# Patient Record
Sex: Female | Born: 1935 | Race: White | Hispanic: No
Health system: Southern US, Community
[De-identification: ages and names within clinical notes are randomized; demographics above are authoritative.]

## PROBLEM LIST (undated history)

## (undated) DIAGNOSIS — J9621 Acute and chronic respiratory failure with hypoxia: Secondary | ICD-10-CM

## (undated) DIAGNOSIS — F0280 Dementia in other diseases classified elsewhere without behavioral disturbance: Secondary | ICD-10-CM

## (undated) DIAGNOSIS — A419 Sepsis, unspecified organism: Secondary | ICD-10-CM

## (undated) DIAGNOSIS — I5032 Chronic diastolic (congestive) heart failure: Secondary | ICD-10-CM

## (undated) DIAGNOSIS — R6520 Severe sepsis without septic shock: Secondary | ICD-10-CM

## (undated) DIAGNOSIS — I482 Chronic atrial fibrillation, unspecified: Secondary | ICD-10-CM

---

## 2019-08-15 ENCOUNTER — Inpatient Hospital Stay
Admission: AD | Admit: 2019-08-15 | Discharge: 2019-09-25 | Disposition: A | Discharge status: Skilled Nursing Facility | Payer: Medicare Other | Source: Other Acute Inpatient Hospital | Attending: Internal Medicine | Admitting: Internal Medicine

## 2019-08-15 ENCOUNTER — Other Ambulatory Visit (HOSPITAL_COMMUNITY): Payer: Medicare Other

## 2019-08-15 DIAGNOSIS — Z4682 Encounter for fitting and adjustment of non-vascular catheter: Secondary | ICD-10-CM

## 2019-08-15 DIAGNOSIS — A419 Sepsis, unspecified organism: Secondary | ICD-10-CM | POA: Diagnosis present

## 2019-08-15 DIAGNOSIS — J9621 Acute and chronic respiratory failure with hypoxia: Secondary | ICD-10-CM | POA: Diagnosis present

## 2019-08-15 DIAGNOSIS — R52 Pain, unspecified: Secondary | ICD-10-CM

## 2019-08-15 DIAGNOSIS — I5032 Chronic diastolic (congestive) heart failure: Secondary | ICD-10-CM | POA: Diagnosis present

## 2019-08-15 DIAGNOSIS — Z931 Gastrostomy status: Secondary | ICD-10-CM

## 2019-08-15 DIAGNOSIS — F0280 Dementia in other diseases classified elsewhere without behavioral disturbance: Secondary | ICD-10-CM | POA: Diagnosis present

## 2019-08-15 DIAGNOSIS — J189 Pneumonia, unspecified organism: Secondary | ICD-10-CM

## 2019-08-15 DIAGNOSIS — G309 Alzheimer's disease, unspecified: Secondary | ICD-10-CM | POA: Diagnosis present

## 2019-08-15 DIAGNOSIS — I482 Chronic atrial fibrillation, unspecified: Secondary | ICD-10-CM | POA: Diagnosis present

## 2019-08-15 HISTORY — DX: Dementia in other diseases classified elsewhere, unspecified severity, without behavioral disturbance, psychotic disturbance, mood disturbance, and anxiety: F02.80

## 2019-08-15 HISTORY — DX: Chronic diastolic (congestive) heart failure: I50.32

## 2019-08-15 HISTORY — DX: Acute and chronic respiratory failure with hypoxia: J96.21

## 2019-08-15 HISTORY — DX: Sepsis, unspecified organism: A41.9

## 2019-08-15 HISTORY — DX: Chronic atrial fibrillation, unspecified: I48.20

## 2019-08-15 HISTORY — DX: Severe sepsis without septic shock: R65.20

## 2019-08-15 LAB — BLOOD GAS, ARTERIAL
Acid-Base Excess: 2.2 mmol/L — ABNORMAL HIGH (ref 0.0–2.0)
Bicarbonate: 25.4 mmol/L (ref 20.0–28.0)
FIO2: 28
O2 Saturation: 90.7 %
Patient temperature: 37
pCO2 arterial: 33.8 mmHg (ref 32.0–48.0)
pH, Arterial: 7.489 — ABNORMAL HIGH (ref 7.350–7.450)
pO2, Arterial: 58.7 mmHg — ABNORMAL LOW (ref 83.0–108.0)

## 2019-08-16 DIAGNOSIS — A419 Sepsis, unspecified organism: Secondary | ICD-10-CM

## 2019-08-16 DIAGNOSIS — I5032 Chronic diastolic (congestive) heart failure: Secondary | ICD-10-CM | POA: Diagnosis not present

## 2019-08-16 DIAGNOSIS — G309 Alzheimer's disease, unspecified: Secondary | ICD-10-CM | POA: Diagnosis not present

## 2019-08-16 DIAGNOSIS — F0280 Dementia in other diseases classified elsewhere without behavioral disturbance: Secondary | ICD-10-CM

## 2019-08-16 DIAGNOSIS — J9621 Acute and chronic respiratory failure with hypoxia: Secondary | ICD-10-CM

## 2019-08-16 DIAGNOSIS — I482 Chronic atrial fibrillation, unspecified: Secondary | ICD-10-CM | POA: Diagnosis not present

## 2019-08-16 DIAGNOSIS — R6520 Severe sepsis without septic shock: Secondary | ICD-10-CM

## 2019-08-16 LAB — CBC WITH DIFFERENTIAL/PLATELET
Abs Immature Granulocytes: 0.09 10*3/uL — ABNORMAL HIGH (ref 0.00–0.07)
Basophils Absolute: 0 10*3/uL (ref 0.0–0.1)
Basophils Relative: 0 %
Eosinophils Absolute: 0.1 10*3/uL (ref 0.0–0.5)
Eosinophils Relative: 1 %
HCT: 34.2 % — ABNORMAL LOW (ref 36.0–46.0)
Hemoglobin: 10.6 g/dL — ABNORMAL LOW (ref 12.0–15.0)
Immature Granulocytes: 1 %
Lymphocytes Relative: 6 %
Lymphs Abs: 0.7 10*3/uL (ref 0.7–4.0)
MCH: 31.4 pg (ref 26.0–34.0)
MCHC: 31 g/dL (ref 30.0–36.0)
MCV: 101.2 fL — ABNORMAL HIGH (ref 80.0–100.0)
Monocytes Absolute: 0.6 10*3/uL (ref 0.1–1.0)
Monocytes Relative: 5 %
Neutro Abs: 10.9 10*3/uL — ABNORMAL HIGH (ref 1.7–7.7)
Neutrophils Relative %: 87 %
Platelets: 260 10*3/uL (ref 150–400)
RBC: 3.38 MIL/uL — ABNORMAL LOW (ref 3.87–5.11)
RDW: 16 % — ABNORMAL HIGH (ref 11.5–15.5)
WBC: 12.4 10*3/uL — ABNORMAL HIGH (ref 4.0–10.5)
nRBC: 0 % (ref 0.0–0.2)

## 2019-08-16 LAB — COMPREHENSIVE METABOLIC PANEL
ALT: 19 U/L (ref 0–44)
AST: 11 U/L — ABNORMAL LOW (ref 15–41)
Albumin: 2.3 g/dL — ABNORMAL LOW (ref 3.5–5.0)
Alkaline Phosphatase: 61 U/L (ref 38–126)
Anion gap: 9 (ref 5–15)
BUN: 32 mg/dL — ABNORMAL HIGH (ref 8–23)
CO2: 26 mmol/L (ref 22–32)
Calcium: 8.6 mg/dL — ABNORMAL LOW (ref 8.9–10.3)
Chloride: 107 mmol/L (ref 98–111)
Creatinine, Ser: 0.67 mg/dL (ref 0.44–1.00)
GFR calc Af Amer: >60 mL/min (ref >60)
GFR calc non Af Amer: >60 mL/min (ref >60)
Glucose, Bld: 186 mg/dL — ABNORMAL HIGH (ref 70–99)
Potassium: 4.8 mmol/L (ref 3.5–5.1)
Sodium: 142 mmol/L (ref 135–145)
Total Bilirubin: 0.4 mg/dL (ref 0.3–1.2)
Total Protein: 5.2 g/dL — ABNORMAL LOW (ref 6.5–8.1)

## 2019-08-16 MED ORDER — GENERIC EXTERNAL MEDICATION
Status: DC
Start: ? — End: 2019-08-16

## 2019-08-16 MED ORDER — INSULIN LISPRO 100 UNIT/ML ~~LOC~~ SOLN
1.00 | SUBCUTANEOUS | Status: DC
Start: ? — End: 2019-08-16

## 2019-08-16 MED ORDER — AMIODARONE HCL 200 MG PO TABS
200.00 | ORAL_TABLET | ORAL | Status: DC
Start: 2019-08-15 — End: 2019-08-16

## 2019-08-16 MED ORDER — METOPROLOL TARTRATE 5 MG/5ML IV SOLN
2.50 | INTRAVENOUS | Status: DC
Start: ? — End: 2019-08-16

## 2019-08-16 MED ORDER — ACETAMINOPHEN 160 MG/5ML PO SUSP
650.00 | ORAL | Status: DC
Start: ? — End: 2019-08-16

## 2019-08-16 MED ORDER — POLYETHYLENE GLYCOL 3350 17 GM/SCOOP PO POWD
17.00 | ORAL | Status: DC
Start: ? — End: 2019-08-16

## 2019-08-16 MED ORDER — ENOXAPARIN SODIUM 60 MG/0.6ML ~~LOC~~ SOLN
60.00 | SUBCUTANEOUS | Status: DC
Start: 2019-08-15 — End: 2019-08-16

## 2019-08-16 MED ORDER — MIDODRINE HCL 5 MG PO TABS
5.00 | ORAL_TABLET | ORAL | Status: DC
Start: 2019-08-15 — End: 2019-08-16

## 2019-08-16 MED ORDER — SODIUM CHLORIDE 0.9 % IV SOLN
10.00 | INTRAVENOUS | Status: DC
Start: ? — End: 2019-08-16

## 2019-08-16 MED ORDER — GENERIC EXTERNAL MEDICATION
2.00 | Status: DC
Start: 2019-08-15 — End: 2019-08-16

## 2019-08-16 MED ORDER — GUAIFENESIN 400 MG PO TABS
400.00 | ORAL_TABLET | ORAL | Status: DC
Start: 2019-08-15 — End: 2019-08-16

## 2019-08-16 MED ORDER — ONDANSETRON HCL 4 MG/2ML IJ SOLN
4.00 | INTRAMUSCULAR | Status: DC
Start: ? — End: 2019-08-16

## 2019-08-16 MED ORDER — FIRST-LANSOPRAZOLE 3 MG/ML PO SUSP
30.00 | ORAL | Status: DC
Start: 2019-08-16 — End: 2019-08-16

## 2019-08-16 MED ORDER — HYDROCORTISONE NA SUCCINATE PF 100 MG IJ SOLR
50.00 | INTRAMUSCULAR | Status: DC
Start: 2019-08-15 — End: 2019-08-16

## 2019-08-16 MED ORDER — FENTANYL CITRATE (PF) 2500 MCG/50ML IJ SOLN
25.00 | INTRAMUSCULAR | Status: DC
Start: ? — End: 2019-08-16

## 2019-08-16 MED ORDER — GLYCOPYRROLATE 1 MG PO TABS
1.00 | ORAL_TABLET | ORAL | Status: DC
Start: 2019-08-15 — End: 2019-08-16

## 2019-08-16 MED ORDER — INSULIN LISPRO 100 UNIT/ML ~~LOC~~ SOLN
1.00 | SUBCUTANEOUS | Status: DC
Start: 2019-08-15 — End: 2019-08-16

## 2019-08-16 MED ORDER — INSULIN GLARGINE 100 UNIT/ML ~~LOC~~ SOLN
1.00 | SUBCUTANEOUS | Status: DC
Start: 2019-08-15 — End: 2019-08-16

## 2019-08-16 MED ORDER — DOCUSATE SODIUM 150 MG/15ML PO LIQD
100.00 | ORAL | Status: DC
Start: 2019-08-15 — End: 2019-08-16

## 2019-08-16 NOTE — Consult Note (Signed)
[facility]  PULMONARY SERVICE  Date of Service: 08/16/2019  PULMONARY CRITICAL CARE CONSULT   Tina Fuentes  YQM:[id number]  DOB: 1928/10/26   DOA: 08/15/2019  Referring Physician: Merton Border, MD  HPI: Tina Fuentes is a 86 y.o. female seen for follow up of Acute on Chronic Respiratory Failure.  Patient has multiple medical problems including pulmonary hypertension delirium aspiration pneumonia hypertension chronic anticoagulation chronic diastolic heart failure Alzheimer's disease chronic atrial fibrillation presented to the hospital with a diagnosis of sepsis encephalopathy multifocal pneumonia acute renal failure.  Patient apparently became acutely confused and stopped talking.  She came into the ED as a code stroke MRI was done which was negative neurology saw the patient was felt that she was confused because of a urinary tract infection.  Chest x-ray also did show what appeared to be a pneumonia.  Patient was admitted to the ICU because of increased respiratory rate apnea.  Apparently patient had been DNR/DNI on admission however the son rescinded the DNR/DNI and made the patient a full code.  Subsequently patient decompensated ended up being intubated despite discussion of goals of care with the family.  The son did not want palliative care involved in the care of the patient.  Patient did received pressors for severe sepsis.  Subsequently after it not being able to come off the ventilator patient ended up having to have a tracheostomy done.  Review of Systems:  ROS performed and is unremarkable other than noted above.    Medications: Reviewed on Rounds  Physical Exam:  Vitals: Temperature is 98.0 pulse 95 respiratory 23 blood pressure is 166/92 saturations 100%  Ventilator Settings mode ventilation pressure assist control FiO2 is 28% PEEP 5 tidal volume 479  . General: Comfortable at this time . Eyes: Grossly normal lids, irises  & conjunctiva . ENT: grossly tongue is normal . Neck: no obvious mass . Cardiovascular: S1-S2 normal no gallop or rub at this time . Respiratory: No rhonchi no rales are noted at this time . Abdomen: Soft and nontender . Skin: no rash seen on limited exam . Musculoskeletal: not rigid . Psychiatric:unable to assess . Neurologic: no seizure no involuntary movements         Labs on Admission:  Basic Metabolic Panel: Recent Labs  Lab 08/16/19 0631  NA 142  K 4.8  CL 107  CO2 26  GLUCOSE 186*  BUN 32*  CREATININE 0.67  CALCIUM 8.6*    Recent Labs  Lab 08/15/19 1800  PHART 7.489*  PCO2ART 33.8  PO2ART 58.7*  HCO3 25.4  O2SAT 90.7    Liver Function Tests: Recent Labs  Lab 08/16/19 0631  AST 11*  ALT 19  ALKPHOS 61  BILITOT 0.4  PROT 5.2*  ALBUMIN 2.3*   No results for input(s): LIPASE, AMYLASE in the last 168 hours. No results for input(s): AMMONIA in the last 168 hours.  CBC: Recent Labs  Lab 08/16/19 0631  WBC 12.4*  NEUTROABS 10.9*  HGB 10.6*  HCT 34.2*  MCV 101.2*  PLT 260    Cardiac Enzymes: No results for input(s): CKTOTAL, CKMB, CKMBINDEX, TROPONINI in the last 168 hours.  BNP (last 3 results) No results for input(s): BNP in the last 8760 hours.  ProBNP (last 3 results) No results for input(s): PROBNP in the last 8760 hours.   Radiological Exams on Admission: DG Abd 1 View  Result Date: 08/15/2019 CLINICAL DATA:  Status post percutaneous endoscopic gastrostomy. Assess tube  placement. EXAM: ABDOMEN - 1 VIEW COMPARISON:  None. FINDINGS: Two views were performed after injection of 50 cc of Omnipaque 300 by the clinical service. Contrast fills the fundus of the stomach. I do not see any extravasated contrast or air. No sign of ileus or obstruction. Rectal probe in place. Chronic curvature in degenerative change of the spine. IMPRESSION: Peg tube within the stomach.  No extravasation demonstrated. Electronically Signed   By: Paulina Fusi M.D.    On: 08/15/2019 18:15   DG CHEST PORT 1 VIEW  Result Date: 08/15/2019 CLINICAL DATA:  Chest tube placement peg placement.  Pneumonia. EXAM: PORTABLE CHEST 1 VIEW COMPARISON:  None. FINDINGS: Tracheostomy tube in place. Right arm PICC tip at the SVC RA junction. Extensive artifact overlies the chest. The right chest is largely clear. There may be mild right base atelectasis. On the left, there is abnormal opacity in the lower chest that probably represents combination of pleural fluid and volume loss/infiltrate. No evidence of pneumothorax. The upper lungs are largely clear. IMPRESSION: Tracheostomy.  Right arm PICC well positioned. Abnormal opacity in the left lower chest, probably a combination of pleural fluid and left lower lobe infiltrate/volume loss. Electronically Signed   By: Paulina Fusi M.D.   On: 08/15/2019 18:14    Assessment/Plan Active Problems:   Acute on chronic respiratory failure with hypoxia (HCC)   Severe sepsis (HCC)   Chronic atrial fibrillation (HCC)   Chronic diastolic heart failure of unknown etiology (HCC)   Alzheimer's dementia without behavioral disturbance (HCC)   1. Acute on chronic respiratory failure with hypoxia patient at this time is on the ventilator and full support.  Patient has been started on wean protocol for pressure support she actually tolerated fairly well today plan is clinically continue to advance weaning on pressure support as tolerated. 2. Severe sepsis treated with antibiotics plan is going to be to continue to follow closely for any decompensation with the blood pressures. 3. Chronic atrial fibrillation rate is controlled at this time we will continue with present management. 4. Chronic diastolic heart failure monitor fluid status currently is compensated 5. Alzheimer's disease no change we will continue with supportive care  I have personally seen and evaluated the patient, evaluated laboratory and imaging results, formulated the assessment and  plan and placed orders. The Patient requires high complexity decision making with multiple systems involvement.  Case was discussed on Rounds with the Respiratory Therapy Director and the Respiratory staff Time Spent  Yevonne Pax, MD [facility] Pulmonary Critical Care Medicine Sleep Medicine

## 2019-08-17 ENCOUNTER — Encounter: Payer: Self-pay | Admitting: Internal Medicine

## 2019-08-17 DIAGNOSIS — G309 Alzheimer's disease, unspecified: Secondary | ICD-10-CM | POA: Diagnosis not present

## 2019-08-17 DIAGNOSIS — I482 Chronic atrial fibrillation, unspecified: Secondary | ICD-10-CM | POA: Diagnosis present

## 2019-08-17 DIAGNOSIS — J9621 Acute and chronic respiratory failure with hypoxia: Secondary | ICD-10-CM | POA: Diagnosis not present

## 2019-08-17 DIAGNOSIS — I5032 Chronic diastolic (congestive) heart failure: Secondary | ICD-10-CM | POA: Diagnosis present

## 2019-08-17 DIAGNOSIS — F0280 Dementia in other diseases classified elsewhere without behavioral disturbance: Secondary | ICD-10-CM | POA: Diagnosis present

## 2019-08-17 DIAGNOSIS — A419 Sepsis, unspecified organism: Secondary | ICD-10-CM | POA: Diagnosis present

## 2019-08-17 MED ORDER — GENERIC EXTERNAL MEDICATION
Status: DC
Start: ? — End: 2019-08-17

## 2019-08-17 NOTE — Progress Notes (Addendum)
Pulmonary Critical Care Medicine [facility] [place]   PULMONARY CRITICAL CARE SERVICE  PROGRESS NOTE  Date of Service: 08/17/2019  Tina Fuentes  WJX:[id number]  DOB: Dec 07, 1928   DOA: 08/15/2019  Referring Physician: Carron Curie, MD  HPI: Tina Fuentes is a 86 y.o. female seen for follow up of Acute on Chronic Respiratory Failure.  Patient continues on pressure support for an 8-hour goal today satting well no fever distress.  Medications: Reviewed on Rounds  Physical Exam:  Vitals: Pulse 83 respirations 23 BP 126/73 O2 sat 99% temp 96.9  Ventilator Settings pressure support 12/5 FiO2 28%  . General: Comfortable at this time . Eyes: Grossly normal lids, irises & conjunctiva . ENT: grossly tongue is normal . Neck: no obvious mass . Cardiovascular: S1 S2 normal no gallop . Respiratory: No rales or rhonchi noted . Abdomen: soft . Skin: no rash seen on limited exam . Musculoskeletal: not rigid . Psychiatric:unable to assess . Neurologic: no seizure no involuntary movements         Lab Data:   Basic Metabolic Panel: Recent Labs  Lab 08/16/19 0631  NA 142  K 4.8  CL 107  CO2 26  GLUCOSE 186*  BUN 32*  CREATININE 0.67  CALCIUM 8.6*    ABG: Recent Labs  Lab 08/15/19 1800  PHART 7.489*  PCO2ART 33.8  PO2ART 58.7*  HCO3 25.4  O2SAT 90.7    Liver Function Tests: Recent Labs  Lab 08/16/19 0631  AST 11*  ALT 19  ALKPHOS 61  BILITOT 0.4  PROT 5.2*  ALBUMIN 2.3*   No results for input(s): LIPASE, AMYLASE in the last 168 hours. No results for input(s): AMMONIA in the last 168 hours.  CBC: Recent Labs  Lab 08/16/19 0631  WBC 12.4*  NEUTROABS 10.9*  HGB 10.6*  HCT 34.2*  MCV 101.2*  PLT 260    Cardiac Enzymes: No results for input(s): CKTOTAL, CKMB, CKMBINDEX, TROPONINI in the last 168 hours.  BNP (last 3 results) No results for input(s): BNP in the last 8760 hours.  ProBNP (last 3 results) No results for input(s): PROBNP in the  last 8760 hours.  Radiological Exams: DG Abd 1 View  Result Date: 08/15/2019 CLINICAL DATA:  Status post percutaneous endoscopic gastrostomy. Assess tube placement. EXAM: ABDOMEN - 1 VIEW COMPARISON:  None. FINDINGS: Two views were performed after injection of 50 cc of Omnipaque 300 by the clinical service. Contrast fills the fundus of the stomach. I do not see any extravasated contrast or air. No sign of ileus or obstruction. Rectal probe in place. Chronic curvature in degenerative change of the spine. IMPRESSION: Peg tube within the stomach.  No extravasation demonstrated. Electronically Signed   By: Paulina Fusi M.D.   On: 08/15/2019 18:15   DG CHEST PORT 1 VIEW  Result Date: 08/15/2019 CLINICAL DATA:  Chest tube placement peg placement.  Pneumonia. EXAM: PORTABLE CHEST 1 VIEW COMPARISON:  None. FINDINGS: Tracheostomy tube in place. Right arm PICC tip at the SVC RA junction. Extensive artifact overlies the chest. The right chest is largely clear. There may be mild right base atelectasis. On the left, there is abnormal opacity in the lower chest that probably represents combination of pleural fluid and volume loss/infiltrate. No evidence of pneumothorax. The upper lungs are largely clear. IMPRESSION: Tracheostomy.  Right arm PICC well positioned. Abnormal opacity in the left lower chest, probably a combination of pleural fluid and left lower lobe infiltrate/volume loss. Electronically Signed   By: Loraine Leriche  Shogry M.D.   On: 08/15/2019 18:14    Assessment/Plan Active Problems:   Acute on chronic respiratory failure with hypoxia (HCC)   Severe sepsis (HCC)   Chronic atrial fibrillation (HCC)   Chronic diastolic heart failure of unknown etiology ([place])   Alzheimer's dementia without behavioral disturbance ([place])   1. Acute on chronic respiratory failure with hypoxia wean today on pressure support over 5 FiO2 28% for an 8-hour goal currently doing well we will continue supportive measures and weaning.   Continue aggressive pulmonary toilet. 2. Severe sepsis treated with antibiotics plan is going to be to continue to follow closely for any decompensation with the blood pressures. 3. Chronic atrial fibrillation rate is controlled at this time we will continue with present management. 4. Chronic diastolic heart failure monitor fluid status currently is compensated 5. Alzheimer's disease no change we will continue with supportive care   I have personally seen and evaluated the patient, evaluated laboratory and imaging results, formulated the assessment and plan and placed orders. The Patient requires high complexity decision making with multiple systems involvement.  Rounds were done with the Respiratory Therapy Director and Staff therapists and discussed with nursing staff also.  Allyne Gee, MD [facility] Pulmonary Critical Care Medicine Sleep Medicine

## 2019-08-18 DIAGNOSIS — I5032 Chronic diastolic (congestive) heart failure: Secondary | ICD-10-CM | POA: Diagnosis not present

## 2019-08-18 DIAGNOSIS — I482 Chronic atrial fibrillation, unspecified: Secondary | ICD-10-CM | POA: Diagnosis not present

## 2019-08-18 DIAGNOSIS — J9621 Acute and chronic respiratory failure with hypoxia: Secondary | ICD-10-CM | POA: Diagnosis not present

## 2019-08-18 DIAGNOSIS — G309 Alzheimer's disease, unspecified: Secondary | ICD-10-CM | POA: Diagnosis not present

## 2019-08-18 NOTE — Progress Notes (Addendum)
Pulmonary Critical Care Medicine [facility] [place]   PULMONARY CRITICAL CARE SERVICE  PROGRESS NOTE  Date of Service: 08/18/2019  Tina Fuentes  [id number]:[id number]  DOB: 1928/11/14   DOA: 08/15/2019  Referring Physician: Carron Curie, MD  HPI: Tina Fuentes is a 86 y.o. female seen for follow up of Acute on Chronic Respiratory Failure.  Patient remains on pressure support today with a 12-hour goal.  Currently satting well no distress.  Medications: Reviewed on Rounds  Physical Exam:  Vitals: Pulse 90 respirations 29 BP 113/66 O2 sat 99% temp 98.6  Ventilator Settings pressure support 10/5 FiO2 28%  . General: Comfortable at this time . Eyes: Grossly normal lids, irises & conjunctiva . ENT: grossly tongue is normal . Neck: no obvious mass . Cardiovascular: S1 S2 normal no gallop . Respiratory: No rales or rhonchi noted . Abdomen: soft . Skin: no rash seen on limited exam . Musculoskeletal: not rigid . Psychiatric:unable to assess . Neurologic: no seizure no involuntary movements         Lab Data:   Basic Metabolic Panel: Recent Labs  Lab 08/16/19 0631  NA 142  K 4.8  CL 107  CO2 26  GLUCOSE 186*  BUN 32*  CREATININE 0.67  CALCIUM 8.6*    ABG: Recent Labs  Lab 08/15/19 1800  PHART 7.489*  PCO2ART 33.8  PO2ART 58.7*  HCO3 25.4  O2SAT 90.7    Liver Function Tests: Recent Labs  Lab 08/16/19 0631  AST 11*  ALT 19  ALKPHOS 61  BILITOT 0.4  PROT 5.2*  ALBUMIN 2.3*   No results for input(s): LIPASE, AMYLASE in the last 168 hours. No results for input(s): AMMONIA in the last 168 hours.  CBC: Recent Labs  Lab 08/16/19 0631  WBC 12.4*  NEUTROABS 10.9*  HGB 10.6*  HCT 34.2*  MCV 101.2*  PLT 260    Cardiac Enzymes: No results for input(s): CKTOTAL, CKMB, CKMBINDEX, TROPONINI in the last 168 hours.  BNP (last 3 results) No results for input(s): BNP in the last 8760 hours.  ProBNP (last 3 results) No results for input(s): PROBNP in  the last 8760 hours.  Radiological Exams: No results found.  Assessment/Plan Active Problems:   Acute on chronic respiratory failure with hypoxia (HCC)   Severe sepsis (HCC)   Chronic atrial fibrillation (HCC)   Chronic diastolic heart failure of unknown etiology (HCC)   Alzheimer's dementia without behavioral disturbance (HCC)   1. Acute on chronic respiratory failure with hypoxia patient will continue to wean currently has a 12-hour goal on 10/5 FiO2 28%.  Continue aggressive pulmonary toilet supportive measures. 2. Severe sepsis treated with antibiotics plan is going to be to continue to follow closely for any decompensation with the blood pressures. 3. Chronic atrial fibrillation rate is controlled at this time we will continue with present management. 4. Chronic diastolic heart failure monitor fluid status currently is compensated 5. Alzheimer's disease no change we will continue with supportive care   I have personally seen and evaluated the patient, evaluated laboratory and imaging results, formulated the assessment and plan and placed orders. The Patient requires high complexity decision making with multiple systems involvement.  Rounds were done with the Respiratory Therapy Director and Staff therapists and discussed with nursing staff also.  Yevonne Pax, MD [facility] Pulmonary Critical Care Medicine Sleep Medicine

## 2019-08-19 DIAGNOSIS — I482 Chronic atrial fibrillation, unspecified: Secondary | ICD-10-CM | POA: Diagnosis not present

## 2019-08-19 DIAGNOSIS — J9621 Acute and chronic respiratory failure with hypoxia: Secondary | ICD-10-CM | POA: Diagnosis not present

## 2019-08-19 DIAGNOSIS — G309 Alzheimer's disease, unspecified: Secondary | ICD-10-CM | POA: Diagnosis not present

## 2019-08-19 DIAGNOSIS — I5032 Chronic diastolic (congestive) heart failure: Secondary | ICD-10-CM | POA: Diagnosis not present

## 2019-08-19 MED ORDER — GENERIC EXTERNAL MEDICATION
Status: DC
Start: ? — End: 2019-08-19

## 2019-08-19 NOTE — Progress Notes (Signed)
Pulmonary Critical Care Medicine [facility] [place]   PULMONARY CRITICAL CARE SERVICE  PROGRESS NOTE  Date of Service: 08/19/2019  Tina Fuentes  [id number]  DOB: 1928/07/21   DOA: 08/15/2019  Referring Physician: Carron Curie, MD  HPI: Tina Fuentes is a 86 y.o. female seen for follow up of Acute on Chronic Respiratory Failure.  Patient is currently on pressure support wean is on 12/5 and requiring 28% FiO2 the goal is today for 16 hours  Medications: Reviewed on Rounds  Physical Exam:  Vitals: Temperature is 96.6 pulse 86 respiratory rate 20 blood pressure is 141/80 saturations 98%  Ventilator Settings mode ventilation pressure support FiO2 28% pressure 12 PEEP 5  . General: Comfortable at this time . Eyes: Grossly normal lids, irises & conjunctiva . ENT: grossly tongue is normal . Neck: no obvious mass . Cardiovascular: S1 S2 normal no gallop . Respiratory: No rhonchi no rales . Abdomen: soft . Skin: no rash seen on limited exam . Musculoskeletal: not rigid . Psychiatric:unable to assess . Neurologic: no seizure no involuntary movements         Lab Data:   Basic Metabolic Panel: Recent Labs  Lab 08/16/19 0631  NA 142  K 4.8  CL 107  CO2 26  GLUCOSE 186*  BUN 32*  CREATININE 0.67  CALCIUM 8.6*    ABG: Recent Labs  Lab 08/15/19 1800  PHART 7.489*  PCO2ART 33.8  PO2ART 58.7*  HCO3 25.4  O2SAT 90.7    Liver Function Tests: Recent Labs  Lab 08/16/19 0631  AST 11*  ALT 19  ALKPHOS 61  BILITOT 0.4  PROT 5.2*  ALBUMIN 2.3*   No results for input(s): LIPASE, AMYLASE in the last 168 hours. No results for input(s): AMMONIA in the last 168 hours.  CBC: Recent Labs  Lab 08/16/19 0631  WBC 12.4*  NEUTROABS 10.9*  HGB 10.6*  HCT 34.2*  MCV 101.2*  PLT 260    Cardiac Enzymes: No results for input(s): CKTOTAL, CKMB, CKMBINDEX, TROPONINI in the last 168 hours.  BNP (last 3 results) No results for input(s): BNP in the last 8760  hours.  ProBNP (last 3 results) No results for input(s): PROBNP in the last 8760 hours.  Radiological Exams: No results found.  Assessment/Plan Active Problems:   Acute on chronic respiratory failure with hypoxia (HCC)   Severe sepsis (HCC)   Chronic atrial fibrillation (HCC)   Chronic diastolic heart failure of unknown etiology (HCC)   Alzheimer's dementia without behavioral disturbance (HCC)   1. Acute on chronic respiratory failure with hypoxia continue with the wean goal of 16 hours on pressure support 2. Severe sepsis resolved clinically 3. Chronic atrial fibrillation rate controlled 4. Chronic diastolic heart failure right now is compensated we will continue to monitor 5. Alzheimer's dementia no change   I have personally seen and evaluated the patient, evaluated laboratory and imaging results, formulated the assessment and plan and placed orders. The Patient requires high complexity decision making with multiple systems involvement.  Rounds were done with the Respiratory Therapy Director and Staff therapists and discussed with nursing staff also.  Yevonne Pax, MD [facility] Pulmonary Critical Care Medicine Sleep Medicine

## 2019-08-20 DIAGNOSIS — J9621 Acute and chronic respiratory failure with hypoxia: Secondary | ICD-10-CM | POA: Diagnosis not present

## 2019-08-20 DIAGNOSIS — I482 Chronic atrial fibrillation, unspecified: Secondary | ICD-10-CM | POA: Diagnosis not present

## 2019-08-20 DIAGNOSIS — G309 Alzheimer's disease, unspecified: Secondary | ICD-10-CM | POA: Diagnosis not present

## 2019-08-20 DIAGNOSIS — I5032 Chronic diastolic (congestive) heart failure: Secondary | ICD-10-CM | POA: Diagnosis not present

## 2019-08-20 NOTE — Progress Notes (Signed)
Pulmonary Critical Care Medicine [facility] [place]   PULMONARY CRITICAL CARE SERVICE  PROGRESS NOTE  Date of Service: 08/20/2019  MAHOGANIE Fuentes  MEQ:[id number]  DOB: 1928/12/15   DOA: 08/15/2019  Referring Physician: Carron Curie, MD  HPI: Tina Fuentes is a 86 y.o. female seen for follow up of Acute on Chronic Respiratory Failure.  Patient is on T collar currently on 28% FiO2 doing fairly well  Medications: Reviewed on Rounds  Physical Exam:  Vitals: Temperature is 96.8 pulse 97 respiratory rate 27 blood pressure is 166/83 saturations 100%  Ventilator Settings on T collar with an FiO2 of 28%  . General: Comfortable at this time . Eyes: Grossly normal lids, irises & conjunctiva . ENT: grossly tongue is normal . Neck: no obvious mass . Cardiovascular: S1 S2 normal no gallop . Respiratory: Coarse breath sounds with few scattered rhonchi . Abdomen: soft . Skin: no rash seen on limited exam . Musculoskeletal: not rigid . Psychiatric:unable to assess . Neurologic: no seizure no involuntary movements         Lab Data:   Basic Metabolic Panel: Recent Labs  Lab 08/16/19 0631  NA 142  K 4.8  CL 107  CO2 26  GLUCOSE 186*  BUN 32*  CREATININE 0.67  CALCIUM 8.6*    ABG: Recent Labs  Lab 08/15/19 1800  PHART 7.489*  PCO2ART 33.8  PO2ART 58.7*  HCO3 25.4  O2SAT 90.7    Liver Function Tests: Recent Labs  Lab 08/16/19 0631  AST 11*  ALT 19  ALKPHOS 61  BILITOT 0.4  PROT 5.2*  ALBUMIN 2.3*   No results for input(s): LIPASE, AMYLASE in the last 168 hours. No results for input(s): AMMONIA in the last 168 hours.  CBC: Recent Labs  Lab 08/16/19 0631  WBC 12.4*  NEUTROABS 10.9*  HGB 10.6*  HCT 34.2*  MCV 101.2*  PLT 260    Cardiac Enzymes: No results for input(s): CKTOTAL, CKMB, CKMBINDEX, TROPONINI in the last 168 hours.  BNP (last 3 results) No results for input(s): BNP in the last 8760 hours.  ProBNP (last 3 results) No results for  input(s): PROBNP in the last 8760 hours.  Radiological Exams: No results found.  Assessment/Plan Active Problems:   Acute on chronic respiratory failure with hypoxia (HCC)   Severe sepsis (HCC)   Chronic atrial fibrillation (HCC)   Chronic diastolic heart failure of unknown etiology (HCC)   Alzheimer's dementia without behavioral disturbance (HCC)   1. Acute on chronic respiratory failure with hypoxia plan is to continue to wean on T collar she is doing well 2. Severe sepsis resolving hemodynamics are stable 3. Chronic atrial fibrillation rate controlled 4. Chronic diastolic heart failure unchanged 5. Alzheimer's dementia no change continue supportive care   I have personally seen and evaluated the patient, evaluated laboratory and imaging results, formulated the assessment and plan and placed orders. The Patient requires high complexity decision making with multiple systems involvement.  Rounds were done with the Respiratory Therapy Director and Staff therapists and discussed with nursing staff also.  Yevonne Pax, MD [facility] Pulmonary Critical Care Medicine Sleep Medicine

## 2019-08-21 DIAGNOSIS — J9621 Acute and chronic respiratory failure with hypoxia: Secondary | ICD-10-CM | POA: Diagnosis not present

## 2019-08-21 DIAGNOSIS — I5032 Chronic diastolic (congestive) heart failure: Secondary | ICD-10-CM | POA: Diagnosis not present

## 2019-08-21 DIAGNOSIS — I482 Chronic atrial fibrillation, unspecified: Secondary | ICD-10-CM | POA: Diagnosis not present

## 2019-08-21 DIAGNOSIS — G309 Alzheimer's disease, unspecified: Secondary | ICD-10-CM | POA: Diagnosis not present

## 2019-08-21 MED ORDER — GENERIC EXTERNAL MEDICATION
Status: DC
Start: ? — End: 2019-08-21

## 2019-08-21 NOTE — Progress Notes (Signed)
Pulmonary Critical Care Medicine [facility] [place]   PULMONARY CRITICAL CARE SERVICE  PROGRESS NOTE  Date of Service: 08/21/2019  Tina Fuentes  UVO:[id number]  DOB: [birth year]   DOA: 08/15/2019  Referring Physician: Carron Curie, MD  HPI: Tina Fuentes is a 86 y.o. female seen for follow up of Acute on Chronic Respiratory Failure.  Patient is on T collar currently on 1028% FiO2 goal today is for 8 hours  Medications: Reviewed on Rounds  Physical Exam:  Vitals: Temperature is 96.9 pulse 82 respiratory 18 blood pressure is 97/60 saturations 99%  Ventilator Settings off the ventilator on T collar trials  . General: Comfortable at this time . Eyes: Grossly normal lids, irises & conjunctiva . ENT: grossly tongue is normal . Neck: no obvious mass . Cardiovascular: S1 S2 normal no gallop . Respiratory: No rhonchi no rales are noted at this time . Abdomen: soft . Skin: no rash seen on limited exam . Musculoskeletal: not rigid . Psychiatric:unable to assess . Neurologic: no seizure no involuntary movements         Lab Data:   Basic Metabolic Panel: Recent Labs  Lab 08/16/19 0631  NA 142  K 4.8  CL 107  CO2 26  GLUCOSE 186*  BUN 32*  CREATININE 0.67  CALCIUM 8.6*    ABG: Recent Labs  Lab 08/15/19 1800  PHART 7.489*  PCO2ART 33.8  PO2ART 58.7*  HCO3 25.4  O2SAT 90.7    Liver Function Tests: Recent Labs  Lab 08/16/19 0631  AST 11*  ALT 19  ALKPHOS 61  BILITOT 0.4  PROT 5.2*  ALBUMIN 2.3*   No results for input(s): LIPASE, AMYLASE in the last 168 hours. No results for input(s): AMMONIA in the last 168 hours.  CBC: Recent Labs  Lab 08/16/19 0631  WBC 12.4*  NEUTROABS 10.9*  HGB 10.6*  HCT 34.2*  MCV 101.2*  PLT 260    Cardiac Enzymes: No results for input(s): CKTOTAL, CKMB, CKMBINDEX, TROPONINI in the last 168 hours.  BNP (last 3 results) No results for input(s): BNP in the last 8760 hours.  ProBNP (last 3 results) No results  for input(s): PROBNP in the last 8760 hours.  Radiological Exams: No results found.  Assessment/Plan Active Problems:   Acute on chronic respiratory failure with hypoxia (HCC)   Severe sepsis (HCC)   Chronic atrial fibrillation (HCC)   Chronic diastolic heart failure of unknown etiology (HCC)   Alzheimer's dementia without behavioral disturbance (HCC)   1. Acute on chronic respiratory failure hypoxia continue with T collar trials as ordered titrate oxygen continue pulmonary toilet 2. Severe sepsis hemodynamically stable resolved 3. Chronic atrial fibrillation rate is controlled 4. Chronic diastolic heart failure compensated 5. Alzheimer's dementia she is at baseline   I have personally seen and evaluated the patient, evaluated laboratory and imaging results, formulated the assessment and plan and placed orders. The Patient requires high complexity decision making with multiple systems involvement.  Rounds were done with the Respiratory Therapy Director and Staff therapists and discussed with nursing staff also.  Yevonne Pax, MD [facility] Pulmonary Critical Care Medicine Sleep Medicine

## 2019-08-22 DIAGNOSIS — I482 Chronic atrial fibrillation, unspecified: Secondary | ICD-10-CM | POA: Diagnosis not present

## 2019-08-22 DIAGNOSIS — I5032 Chronic diastolic (congestive) heart failure: Secondary | ICD-10-CM | POA: Diagnosis not present

## 2019-08-22 DIAGNOSIS — J9621 Acute and chronic respiratory failure with hypoxia: Secondary | ICD-10-CM | POA: Diagnosis not present

## 2019-08-22 DIAGNOSIS — G309 Alzheimer's disease, unspecified: Secondary | ICD-10-CM | POA: Diagnosis not present

## 2019-08-22 NOTE — Progress Notes (Signed)
Pulmonary Critical Care Medicine [facility] [place]   PULMONARY CRITICAL CARE SERVICE  PROGRESS NOTE  Date of Service: 08/22/2019  Tina Fuentes  JIR:[id number]  DOB: 1928-09-02   DOA: 08/15/2019  Referring Physician: Carron Curie, MD  HPI: Tina Fuentes is a 86 y.o. female seen for follow up of Acute on Chronic Respiratory Failure.  Patient currently is on T collar on 28% FiO2 the goal is for 12 hours  Medications: Reviewed on Rounds  Physical Exam:  Vitals: Temperature 97.0 pulse 91 respiratory 30 blood pressure is 119/64 saturations 100%  Ventilator Settings off the ventilator on T collar wean  . General: Comfortable at this time . Eyes: Grossly normal lids, irises & conjunctiva . ENT: grossly tongue is normal . Neck: no obvious mass . Cardiovascular: S1 S2 normal no gallop . Respiratory: No rhonchi no rales are noted at this time . Abdomen: soft . Skin: no rash seen on limited exam . Musculoskeletal: not rigid . Psychiatric:unable to assess . Neurologic: no seizure no involuntary movements         Lab Data:   Basic Metabolic Panel: Recent Labs  Lab 08/16/19 0631  NA 142  K 4.8  CL 107  CO2 26  GLUCOSE 186*  BUN 32*  CREATININE 0.67  CALCIUM 8.6*    ABG: Recent Labs  Lab 08/15/19 1800  PHART 7.489*  PCO2ART 33.8  PO2ART 58.7*  HCO3 25.4  O2SAT 90.7    Liver Function Tests: Recent Labs  Lab 08/16/19 0631  AST 11*  ALT 19  ALKPHOS 61  BILITOT 0.4  PROT 5.2*  ALBUMIN 2.3*   No results for input(s): LIPASE, AMYLASE in the last 168 hours. No results for input(s): AMMONIA in the last 168 hours.  CBC: Recent Labs  Lab 08/16/19 0631  WBC 12.4*  NEUTROABS 10.9*  HGB 10.6*  HCT 34.2*  MCV 101.2*  PLT 260    Cardiac Enzymes: No results for input(s): CKTOTAL, CKMB, CKMBINDEX, TROPONINI in the last 168 hours.  BNP (last 3 results) No results for input(s): BNP in the last 8760 hours.  ProBNP (last 3 results) No results for  input(s): PROBNP in the last 8760 hours.  Radiological Exams: No results found.  Assessment/Plan Active Problems:   Acute on chronic respiratory failure with hypoxia (HCC)   Severe sepsis (HCC)   Chronic atrial fibrillation (HCC)   Chronic diastolic heart failure of unknown etiology (HCC)   Alzheimer's dementia without behavioral disturbance (HCC)   1. Acute on chronic respiratory failure with hypoxia plan is to continue with weaning 12-hour goal on T collar 2. Severe sepsis resolved hemodynamics are stable 3. Chronic atrial fibrillation rate controlled 4. Chronic diastolic heart failure baseline 5. Alzheimer's dementia no change   I have personally seen and evaluated the patient, evaluated laboratory and imaging results, formulated the assessment and plan and placed orders. The Patient requires high complexity decision making with multiple systems involvement.  Rounds were done with the Respiratory Therapy Director and Staff therapists and discussed with nursing staff also.  Yevonne Pax, MD [facility] Pulmonary Critical Care Medicine Sleep Medicine

## 2019-08-23 DIAGNOSIS — G309 Alzheimer's disease, unspecified: Secondary | ICD-10-CM | POA: Diagnosis not present

## 2019-08-23 DIAGNOSIS — I482 Chronic atrial fibrillation, unspecified: Secondary | ICD-10-CM | POA: Diagnosis not present

## 2019-08-23 DIAGNOSIS — J9621 Acute and chronic respiratory failure with hypoxia: Secondary | ICD-10-CM | POA: Diagnosis not present

## 2019-08-23 DIAGNOSIS — I5032 Chronic diastolic (congestive) heart failure: Secondary | ICD-10-CM | POA: Diagnosis not present

## 2019-08-23 DIAGNOSIS — A419 Sepsis, unspecified organism: Secondary | ICD-10-CM

## 2019-08-23 DIAGNOSIS — R6520 Severe sepsis without septic shock: Secondary | ICD-10-CM

## 2019-08-23 DIAGNOSIS — F0280 Dementia in other diseases classified elsewhere without behavioral disturbance: Secondary | ICD-10-CM

## 2019-08-23 MED ORDER — GENERIC EXTERNAL MEDICATION
Status: DC
Start: ? — End: 2019-08-23

## 2019-08-23 NOTE — Progress Notes (Signed)
Pulmonary Critical Care Medicine [facility] [place]   PULMONARY CRITICAL CARE SERVICE  PROGRESS NOTE  Date of Service: 08/23/2019  Tina Fuentes  [id number]  DOB: [birth year]   DOA: 08/15/2019  Referring Physician: Carron Curie, MD  HPI: Tina Fuentes is a 86 y.o. female seen for follow up of Acute on Chronic Respiratory Failure.  Patient at this time is on Tcollar for has been on 28% FiO2 saturations are excellent right now so on the goal of 16 hours  Medications: Reviewed on Rounds  Physical Exam:  Vitals: Temperature is 96.3 pulse 97 respiratory 25 blood pressure is 145/80 saturations are 98%  Ventilator Settings on T collar 16-hour goal  . General: Comfortable at this time . Eyes: Grossly normal lids, irises & conjunctiva . ENT: grossly tongue is normal . Neck: no obvious mass . Cardiovascular: S1 S2 normal no gallop . Respiratory: Coarse breath sounds with few scattered rhonchi . Abdomen: soft . Skin: no rash seen on limited exam . Musculoskeletal: not rigid . Psychiatric:unable to assess . Neurologic: no seizure no involuntary movements         Lab Data:   Basic Metabolic Panel: No results for input(s): NA, K, CL, CO2, GLUCOSE, BUN, CREATININE, CALCIUM, MG, PHOS in the last 168 hours.  ABG: No results for input(s): PHART, PCO2ART, PO2ART, HCO3, O2SAT in the last 168 hours.  Liver Function Tests: No results for input(s): AST, ALT, ALKPHOS, BILITOT, PROT, ALBUMIN in the last 168 hours. No results for input(s): LIPASE, AMYLASE in the last 168 hours. No results for input(s): AMMONIA in the last 168 hours.  CBC: No results for input(s): WBC, NEUTROABS, HGB, HCT, MCV, PLT in the last 168 hours.  Cardiac Enzymes: No results for input(s): CKTOTAL, CKMB, CKMBINDEX, TROPONINI in the last 168 hours.  BNP (last 3 results) No results for input(s): BNP in the last 8760 hours.  ProBNP (last 3 results) No results for input(s): PROBNP in the last 8760  hours.  Radiological Exams: No results found.  Assessment/Plan Active Problems:   Acute on chronic respiratory failure with hypoxia (HCC)   Severe sepsis (HCC)   Chronic atrial fibrillation (HCC)   Chronic diastolic heart failure of unknown etiology (HCC)   Alzheimer's dementia without behavioral disturbance (HCC)   1. Acute on chronic respiratory failure with hypoxia we will continue with T collar trials currently on 28% FiO2 with a goal of 16 hours 2. Severe sepsis hemodynamics are stable we will continue to monitor 3. Chronic atrial fibrillation rate is controlled 4. Chronic diastolic heart failure unchanged we will continue to follow along.   I have personally seen and evaluated the patient, evaluated laboratory and imaging results, formulated the assessment and plan and placed orders. The Patient requires high complexity decision making with multiple systems involvement.  Rounds were done with the Respiratory Therapy Director and Staff therapists and discussed with nursing staff also.  Yevonne Pax, MD [facility] Pulmonary Critical Care Medicine Sleep Medicine

## 2019-08-24 DIAGNOSIS — I482 Chronic atrial fibrillation, unspecified: Secondary | ICD-10-CM | POA: Diagnosis not present

## 2019-08-24 DIAGNOSIS — I5032 Chronic diastolic (congestive) heart failure: Secondary | ICD-10-CM | POA: Diagnosis not present

## 2019-08-24 DIAGNOSIS — G309 Alzheimer's disease, unspecified: Secondary | ICD-10-CM | POA: Diagnosis not present

## 2019-08-24 DIAGNOSIS — J9621 Acute and chronic respiratory failure with hypoxia: Secondary | ICD-10-CM | POA: Diagnosis not present

## 2019-08-24 NOTE — Progress Notes (Addendum)
Pulmonary Critical Care Medicine [facility] [place]   PULMONARY CRITICAL CARE SERVICE  PROGRESS NOTE  Date of Service: 08/24/2019  Tina Fuentes  HQR:[id number]  DOB: 05/11/1929   DOA: 08/15/2019  Referring Physician: Carron Curie, MD  HPI: Tina Fuentes is a 86 y.o. female seen for follow up of Acute on Chronic Respiratory Failure.  Patient continues on aerosol trach collar 20% FiO2 using PMV with no difficulty.  Has a goal of 20 hours at this time.  Medications: Reviewed on Rounds  Physical Exam:  Vitals: Pulse 87 respirations 19 BP 128/65 O2 sat 96% temp 96.6  Ventilator Settings ATC 28%  . General: Comfortable at this time . Eyes: Grossly normal lids, irises & conjunctiva . ENT: grossly tongue is normal . Neck: no obvious mass . Cardiovascular: S1 S2 normal no gallop . Respiratory: No rales or rhonchi noted . Abdomen: soft . Skin: no rash seen on limited exam . Musculoskeletal: not rigid . Psychiatric:unable to assess . Neurologic: no seizure no involuntary movements         Lab Data:   Basic Metabolic Panel: No results for input(s): NA, K, CL, CO2, GLUCOSE, BUN, CREATININE, CALCIUM, MG, PHOS in the last 168 hours.  ABG: No results for input(s): PHART, PCO2ART, PO2ART, HCO3, O2SAT in the last 168 hours.  Liver Function Tests: No results for input(s): AST, ALT, ALKPHOS, BILITOT, PROT, ALBUMIN in the last 168 hours. No results for input(s): LIPASE, AMYLASE in the last 168 hours. No results for input(s): AMMONIA in the last 168 hours.  CBC: No results for input(s): WBC, NEUTROABS, HGB, HCT, MCV, PLT in the last 168 hours.  Cardiac Enzymes: No results for input(s): CKTOTAL, CKMB, CKMBINDEX, TROPONINI in the last 168 hours.  BNP (last 3 results) No results for input(s): BNP in the last 8760 hours.  ProBNP (last 3 results) No results for input(s): PROBNP in the last 8760 hours.  Radiological Exams: No results found.  Assessment/Plan Active  Problems:   Acute on chronic respiratory failure with hypoxia (HCC)   Severe sepsis (HCC)   Chronic atrial fibrillation (HCC)   Chronic diastolic heart failure of unknown etiology (HCC)   Alzheimer's dementia without behavioral disturbance (HCC)   1. Acute on chronic respiratory failure with hypoxia we will continue with T collar trials currently on 28% FiO2 with a goal of 16 hours 2. Severe sepsis hemodynamics are stable we will continue to monitor 3. Chronic atrial fibrillation rate is controlled 4. Chronic diastolic heart failure unchanged we will continue to follow along.   I have personally seen and evaluated the patient, evaluated laboratory and imaging results, formulated the assessment and plan and placed orders. The Patient requires high complexity decision making with multiple systems involvement.  Rounds were done with the Respiratory Therapy Director and Staff therapists and discussed with nursing staff also.  Yevonne Pax, MD [facility] Pulmonary Critical Care Medicine Sleep Medicine

## 2019-08-25 DIAGNOSIS — J9621 Acute and chronic respiratory failure with hypoxia: Secondary | ICD-10-CM | POA: Diagnosis not present

## 2019-08-25 DIAGNOSIS — G309 Alzheimer's disease, unspecified: Secondary | ICD-10-CM | POA: Diagnosis not present

## 2019-08-25 DIAGNOSIS — I482 Chronic atrial fibrillation, unspecified: Secondary | ICD-10-CM | POA: Diagnosis not present

## 2019-08-25 DIAGNOSIS — I5032 Chronic diastolic (congestive) heart failure: Secondary | ICD-10-CM | POA: Diagnosis not present

## 2019-08-25 LAB — CBC
HCT: 37.5 % (ref 36.0–46.0)
Hemoglobin: 11.5 g/dL — ABNORMAL LOW (ref 12.0–15.0)
MCH: 31 pg (ref 26.0–34.0)
MCHC: 30.7 g/dL (ref 30.0–36.0)
MCV: 101.1 fL — ABNORMAL HIGH (ref 80.0–100.0)
Platelets: 155 10*3/uL (ref 150–400)
RBC: 3.71 MIL/uL — ABNORMAL LOW (ref 3.87–5.11)
RDW: 17.2 % — ABNORMAL HIGH (ref 11.5–15.5)
WBC: 13 10*3/uL — ABNORMAL HIGH (ref 4.0–10.5)
nRBC: 0 % (ref 0.0–0.2)

## 2019-08-25 LAB — BASIC METABOLIC PANEL
Anion gap: 11 (ref 5–15)
BUN: 44 mg/dL — ABNORMAL HIGH (ref 8–23)
CO2: 26 mmol/L (ref 22–32)
Calcium: 8.8 mg/dL — ABNORMAL LOW (ref 8.9–10.3)
Chloride: 104 mmol/L (ref 98–111)
Creatinine, Ser: 0.61 mg/dL (ref 0.44–1.00)
GFR calc Af Amer: >60 mL/min (ref >60)
GFR calc non Af Amer: >60 mL/min (ref >60)
Glucose, Bld: 162 mg/dL — ABNORMAL HIGH (ref 70–99)
Potassium: 4.3 mmol/L (ref 3.5–5.1)
Sodium: 141 mmol/L (ref 135–145)

## 2019-08-25 MED ORDER — GENERIC EXTERNAL MEDICATION
Status: DC
Start: ? — End: 2019-08-25

## 2019-08-25 NOTE — Progress Notes (Addendum)
Pulmonary Critical Care Medicine [facility] [place]   PULMONARY CRITICAL CARE SERVICE  PROGRESS NOTE  Date of Service: 08/25/2019  CARRIANNE HYUN  KDT:[id number]  DOB: 03/20/1929   DOA: 08/15/2019  Referring Physician: Carron Curie, MD  HPI: Hydro PAONE is a 86 y.o. female seen for follow up of Acute on Chronic Respiratory Failure. Pt is unable to wean today due to tachycardia, and tachyapnea.  Remains on full support on the vent, no fever or distress noted.   Medications: Reviewed on Rounds  Physical Exam:  Vitals: Pulse 85, resp 24, bp 125/75, o2 sat 96%, Temp 98.3  Ventilator Settings ventilator mode Pressure control  . General: Comfortable at this time . Eyes: Grossly normal lids, irises & conjunctiva . ENT: grossly tongue is normal . Neck: no obvious mass . Cardiovascular: S1 S2 normal no gallop . Respiratory: no rales or ronchi noted . Abdomen: soft . Skin: no rash seen on limited exam . Musculoskeletal: not rigid . Psychiatric:unable to assess . Neurologic: no seizure no involuntary movements         Lab Data:   Basic Metabolic Panel: Recent Labs  Lab 08/25/19 1101  NA 141  K 4.3  CL 104  CO2 26  GLUCOSE 162*  BUN 44*  CREATININE 0.61  CALCIUM 8.8*    ABG: No results for input(s): PHART, PCO2ART, PO2ART, HCO3, O2SAT in the last 168 hours.  Liver Function Tests: No results for input(s): AST, ALT, ALKPHOS, BILITOT, PROT, ALBUMIN in the last 168 hours. No results for input(s): LIPASE, AMYLASE in the last 168 hours. No results for input(s): AMMONIA in the last 168 hours.  CBC: Recent Labs  Lab 08/25/19 1022  WBC 13.0*  HGB 11.5*  HCT 37.5  MCV 101.1*  PLT 155    enzymes: No results for input(s): CKTOTAL, CKMB, CKMBINDEX, TROPONINI in the last 168 hours.  BNP (last 3 results) No results for input(s): BNP in the last 8760 hours.  ProBNP (last 3 results) No results for input(s): PROBNP in the last 8760 hours.  Radiological  Exams: No results found.  Assessment/Plan Active Problems:   Acute on chronic respiratory failure with hypoxia (HCC)   Severe sepsis (HCC)   Chronic atrial fibrillation (HCC)   Chronic diastolic heart failure of unknown etiology (HCC)   Alzheimer's dementia without behavioral disturbance (HCC)   1. Acute on chronic respiratory failure with hypoxia Plan is to continue to attempt weaning as patient is able.  Continue aggressive pulmonary toilet and supportive measures.  2. Severe sepsis hemodynamics are stable we will continue to monitor 3. Chronic atrial fibrillation rate is controlled 4. Chronic diastolic heart failure unchanged we will continue to follow along.   I have personally seen and evaluated the patient, evaluated laboratory and imaging results, formulated the assessment and plan and placed orders. The Patient requires high complexity decision making with multiple systems involvement.  Rounds were done with the Respiratory Therapy Director and Staff therapists and discussed with nursing staff also.  Yevonne Pax, MD [facility] Pulmonary Critical Care Medicine Sleep Medicine

## 2019-08-26 DIAGNOSIS — J9621 Acute and chronic respiratory failure with hypoxia: Secondary | ICD-10-CM | POA: Diagnosis not present

## 2019-08-26 DIAGNOSIS — I482 Chronic atrial fibrillation, unspecified: Secondary | ICD-10-CM | POA: Diagnosis not present

## 2019-08-26 DIAGNOSIS — I5032 Chronic diastolic (congestive) heart failure: Secondary | ICD-10-CM | POA: Diagnosis not present

## 2019-08-26 DIAGNOSIS — G309 Alzheimer's disease, unspecified: Secondary | ICD-10-CM | POA: Diagnosis not present

## 2019-08-26 NOTE — Progress Notes (Addendum)
Pulmonary Critical Care Medicine [facility] [place]   PULMONARY CRITICAL CARE SERVICE  PROGRESS NOTE  Date of Service: 08/26/2019  Tina Fuentes  DDU:[id number]  DOB: Dec 27, 1928   DOA: 08/15/2019  Referring Physician: Carron Curie, MD  HPI: Tina Fuentes is a 86 y.o. female seen for follow up of Acute on Chronic Respiratory Failure.  Patient remains on aerosol trach collar 28% for goal 24 hours today.  Currently satting well no fever or distress.  Medications: Reviewed on Rounds  Physical Exam:  Vitals: Pulse 90 respirations 35 BP 124/76 O2 sat 9% temp 97.4  Ventilator Settings ATC 28%  . General: Comfortable at this time . Eyes: Grossly normal lids, irises & conjunctiva . ENT: grossly tongue is normal . Neck: no obvious mass . Cardiovascular: S1 S2 normal no gallop . Respiratory: No rales or rhonchi noted . Abdomen: soft . Skin: no rash seen on limited exam . Musculoskeletal: not rigid . Psychiatric:unable to assess . Neurologic: no seizure no involuntary movements         Lab Data:   Basic Metabolic Panel: Recent Labs  Lab 08/25/19 1101  NA 141  K 4.3  CL 104  CO2 26  GLUCOSE 162*  BUN 44*  CREATININE 0.61  CALCIUM 8.8*    ABG: No results for input(s): PHART, PCO2ART, PO2ART, HCO3, O2SAT in the last 168 hours.  Liver Function Tests: No results for input(s): AST, ALT, ALKPHOS, BILITOT, PROT, ALBUMIN in the last 168 hours. No results for input(s): LIPASE, AMYLASE in the last 168 hours. No results for input(s): AMMONIA in the last 168 hours.  CBC: Recent Labs  Lab 08/25/19 1022  WBC 13.0*  HGB 11.5*  HCT 37.5  MCV 101.1*  PLT 155    Cardiac Enzymes: No results for input(s): CKTOTAL, CKMB, CKMBINDEX, TROPONINI in the last 168 hours.  BNP (last 3 results) No results for input(s): BNP in the last 8760 hours.  ProBNP (last 3 results) No results for input(s): PROBNP in the last 8760 hours.  Radiological Exams: No results  found.  Assessment/Plan Active Problems:   Acute on chronic respiratory failure with hypoxia (HCC)   Severe sepsis (HCC)   Chronic atrial fibrillation (HCC)   Chronic diastolic heart failure of unknown etiology (HCC)   Alzheimer's dementia without behavioral disturbance (HCC)   1. Acute on chronic respiratory failure with hypoxia Plan is to continue to attempt weaning as patient is able.  Continue aggressive pulmonary toilet and supportive measures.  2. Severe sepsis hemodynamics are stable we will continue to monitor 3. Chronic atrial fibrillation rate is controlled 4. Chronic diastolic heart failure unchanged we will continue to follow along.   I have personally seen and evaluated the patient, evaluated laboratory and imaging results, formulated the assessment and plan and placed orders. The Patient requires high complexity decision making with multiple systems involvement.  Rounds were done with the Respiratory Therapy Director and Staff therapists and discussed with nursing staff also.  Yevonne Pax, MD [facility] Pulmonary Critical Care Medicine Sleep Medicine

## 2019-08-27 DIAGNOSIS — J9621 Acute and chronic respiratory failure with hypoxia: Secondary | ICD-10-CM | POA: Diagnosis not present

## 2019-08-27 DIAGNOSIS — I5032 Chronic diastolic (congestive) heart failure: Secondary | ICD-10-CM | POA: Diagnosis not present

## 2019-08-27 DIAGNOSIS — I482 Chronic atrial fibrillation, unspecified: Secondary | ICD-10-CM | POA: Diagnosis not present

## 2019-08-27 DIAGNOSIS — G309 Alzheimer's disease, unspecified: Secondary | ICD-10-CM | POA: Diagnosis not present

## 2019-08-27 LAB — BLOOD GAS, ARTERIAL
Acid-Base Excess: 4.1 mmol/L — ABNORMAL HIGH (ref 0.0–2.0)
Bicarbonate: 27.6 mmol/L (ref 20.0–28.0)
FIO2: 28
O2 Saturation: 97.8 %
Patient temperature: 37.1
pCO2 arterial: 37.4 mmHg (ref 32.0–48.0)
pH, Arterial: 7.481 — ABNORMAL HIGH (ref 7.350–7.450)
pO2, Arterial: 89.9 mmHg (ref 83.0–108.0)

## 2019-08-27 MED ORDER — GENERIC EXTERNAL MEDICATION
Status: DC
Start: ? — End: 2019-08-27

## 2019-08-27 NOTE — Progress Notes (Signed)
Pulmonary Critical Care Medicine [facility] [place]   PULMONARY CRITICAL CARE SERVICE  PROGRESS NOTE  Date of Service: 08/27/2019  Tina Fuentes  KDX:[id number]  DOB: [birth year]   DOA: 08/15/2019  Referring Physician: Carron Curie, MD  HPI: Tina Fuentes is a 86 y.o. female seen for follow up of Acute on Chronic Respiratory Failure.  Patient is currently on T collar 48 hours using PMV  Medications: Reviewed on Rounds  Physical Exam:  Vitals: Temperature is 97.7 pulse 81 respiratory 22 blood pressure is 139/79 saturations 96%  Ventilator Settings on T collar FiO2 28%  . General: Comfortable at this time . Eyes: Grossly normal lids, irises & conjunctiva . ENT: grossly tongue is normal . Neck: no obvious mass . Cardiovascular: S1 S2 normal no gallop . Respiratory: No rhonchi coarse breath sounds are noted . Abdomen: soft . Skin: no rash seen on limited exam . Musculoskeletal: not rigid . Psychiatric:unable to assess . Neurologic: no seizure no involuntary movements         Lab Data:   Basic Metabolic Panel: Recent Labs  Lab 08/25/19 1101  NA 141  K 4.3  CL 104  CO2 26  GLUCOSE 162*  BUN 44*  CREATININE 0.61  CALCIUM 8.8*    ABG: Recent Labs  Lab 08/27/19 0900  PHART 7.481*  PCO2ART 37.4  PO2ART 89.9  HCO3 27.6  O2SAT 97.8    Liver Function Tests: No results for input(s): AST, ALT, ALKPHOS, BILITOT, PROT, ALBUMIN in the last 168 hours. No results for input(s): LIPASE, AMYLASE in the last 168 hours. No results for input(s): AMMONIA in the last 168 hours.  CBC: Recent Labs  Lab 08/25/19 1022  WBC 13.0*  HGB 11.5*  HCT 37.5  MCV 101.1*  PLT 155    Cardiac Enzymes: No results for input(s): CKTOTAL, CKMB, CKMBINDEX, TROPONINI in the last 168 hours.  BNP (last 3 results) No results for input(s): BNP in the last 8760 hours.  ProBNP (last 3 results) No results for input(s): PROBNP in the last 8760 hours.  Radiological Exams: No  results found.  Assessment/Plan Active Problems:   Acute on chronic respiratory failure with hypoxia (HCC)   Severe sepsis (HCC)   Chronic atrial fibrillation (HCC)   Chronic diastolic heart failure of unknown etiology (HCC)   Alzheimer's dementia without behavioral disturbance (HCC)   1. Acute on chronic respiratory failure with hypoxia continue with T collar trials titrate oxygen continue pulmonary toilet follows 48 hours today 2. Severe sepsis resolved 3. Chronic atrial fibrillation rate is controlled 4. Chronic diastolic heart failure compensated 5. Alzheimer's dementia no change   I have personally seen and evaluated the patient, evaluated laboratory and imaging results, formulated the assessment and plan and placed orders. The Patient requires high complexity decision making with multiple systems involvement.  Rounds were done with the Respiratory Therapy Director and Staff therapists and discussed with nursing staff also.  Yevonne Pax, MD [facility] Pulmonary Critical Care Medicine Sleep Medicine

## 2019-08-28 DIAGNOSIS — I5032 Chronic diastolic (congestive) heart failure: Secondary | ICD-10-CM | POA: Diagnosis not present

## 2019-08-28 DIAGNOSIS — I482 Chronic atrial fibrillation, unspecified: Secondary | ICD-10-CM | POA: Diagnosis not present

## 2019-08-28 DIAGNOSIS — J9621 Acute and chronic respiratory failure with hypoxia: Secondary | ICD-10-CM | POA: Diagnosis not present

## 2019-08-28 DIAGNOSIS — G309 Alzheimer's disease, unspecified: Secondary | ICD-10-CM | POA: Diagnosis not present

## 2019-08-28 NOTE — Progress Notes (Signed)
Pulmonary Critical Care Medicine [facility] [place]   PULMONARY CRITICAL CARE SERVICE  PROGRESS NOTE  Date of Service: 08/28/2019  Tina Fuentes  LFY:[id number]  DOB: [birth year]   DOA: 08/15/2019  Referring Physician: Carron Curie, MD  HPI: Tina Fuentes is a 86 y.o. female seen for follow up of Acute on Chronic Respiratory Failure.  Patient at this time is on T collar + currently 28% FiO2 with the PMV in place to go with 24 hours  Medications: Reviewed on Rounds  Physical Exam:  Vitals: Temperature is 96.9 pulse 83 respiratory rate is 21 blood pressure is 162/85 saturations 98%  Ventilator Settings on T collar FiO2 28% with PMV  . General: Comfortable at this time . Eyes: Grossly normal lids, irises & conjunctiva . ENT: grossly tongue is normal . Neck: no obvious mass . Cardiovascular: S1 S2 normal no gallop . Respiratory: No rhonchi coarse breath sounds . Abdomen: soft . Skin: no rash seen on limited exam . Musculoskeletal: not rigid . Psychiatric:unable to assess . Neurologic: no seizure no involuntary movements         Lab Data:   Basic Metabolic Panel: Recent Labs  Lab 08/25/19 1101  NA 141  K 4.3  CL 104  CO2 26  GLUCOSE 162*  BUN 44*  CREATININE 0.61  CALCIUM 8.8*    ABG: Recent Labs  Lab 08/27/19 0900  PHART 7.481*  PCO2ART 37.4  PO2ART 89.9  HCO3 27.6  O2SAT 97.8    Liver Function Tests: No results for input(s): AST, ALT, ALKPHOS, BILITOT, PROT, ALBUMIN in the last 168 hours. No results for input(s): LIPASE, AMYLASE in the last 168 hours. No results for input(s): AMMONIA in the last 168 hours.  CBC: Recent Labs  Lab 08/25/19 1022  WBC 13.0*  HGB 11.5*  HCT 37.5  MCV 101.1*  PLT 155    Cardiac Enzymes: No results for input(s): CKTOTAL, CKMB, CKMBINDEX, TROPONINI in the last 168 hours.  BNP (last 3 results) No results for input(s): BNP in the last 8760 hours.  ProBNP (last 3 results) No results for input(s): PROBNP  in the last 8760 hours.  Radiological Exams: No results found.  Assessment/Plan Active Problems:   Acute on chronic respiratory failure with hypoxia (HCC)   Severe sepsis (HCC)   Chronic atrial fibrillation (HCC)   Chronic diastolic heart failure of unknown etiology (HCC)   Alzheimer's dementia without behavioral disturbance (HCC)   1. Acute on chronic respiratory failure with hypoxia continue with T collar trials titrate oxygen continue pulmonary toilet with PMV 2. Severe sepsis resolved hemodynamics are stable 3. Chronic atrial fibrillation rate controlled 4. Chronic diastolic heart failure no change 5. Alzheimer's dementia patient is at baseline we will continue to monitor   I have personally seen and evaluated the patient, evaluated laboratory and imaging results, formulated the assessment and plan and placed orders. The Patient requires high complexity decision making with multiple systems involvement.  Rounds were done with the Respiratory Therapy Director and Staff therapists and discussed with nursing staff also.  Yevonne Pax, MD [facility] Pulmonary Critical Care Medicine Sleep Medicine

## 2019-08-29 DIAGNOSIS — J9621 Acute and chronic respiratory failure with hypoxia: Secondary | ICD-10-CM | POA: Diagnosis not present

## 2019-08-29 DIAGNOSIS — I482 Chronic atrial fibrillation, unspecified: Secondary | ICD-10-CM | POA: Diagnosis not present

## 2019-08-29 DIAGNOSIS — I5032 Chronic diastolic (congestive) heart failure: Secondary | ICD-10-CM | POA: Diagnosis not present

## 2019-08-29 DIAGNOSIS — G309 Alzheimer's disease, unspecified: Secondary | ICD-10-CM | POA: Diagnosis not present

## 2019-08-29 MED ORDER — GENERIC EXTERNAL MEDICATION
Status: DC
Start: ? — End: 2019-08-29

## 2019-08-29 NOTE — Progress Notes (Addendum)
Pulmonary Critical Care Medicine [facility] [place]   PULMONARY CRITICAL CARE SERVICE  PROGRESS NOTE  Date of Service: 08/29/2019  Tina Fuentes  [id number]:[id number]  DOB: August 23, 1928   DOA: 08/15/2019  Referring Physician: Carron Curie, MD  HPI: Tina Fuentes is a 86 y.o. female seen for follow up of Acute on Chronic Respiratory Failure.  Patient continues on aerosol trach collar 28% FiO2 per 72-hour goal at this time.  Satting well no distress.  Medications: Reviewed on Rounds  Physical Exam:  Vitals: Pulse 86 respirations 30 BP 165/96 O2 sat 94% temp 96.0  Ventilator Settings ATC 28%  . General: Comfortable at this time . Eyes: Grossly normal lids, irises & conjunctiva . ENT: grossly tongue is normal . Neck: no obvious mass . Cardiovascular: S1 S2 normal no gallop . Respiratory: No rales or rhonchi noted . Abdomen: soft . Skin: no rash seen on limited exam . Musculoskeletal: not rigid . Psychiatric:unable to assess . Neurologic: no seizure no involuntary movements         Lab Data:   Basic Metabolic Panel: Recent Labs  Lab 08/25/19 1101  NA 141  K 4.3  CL 104  CO2 26  GLUCOSE 162*  BUN 44*  CREATININE 0.61  CALCIUM 8.8*    ABG: Recent Labs  Lab 08/27/19 0900  PHART 7.481*  PCO2ART 37.4  PO2ART 89.9  HCO3 27.6  O2SAT 97.8    Liver Function Tests: No results for input(s): AST, ALT, ALKPHOS, BILITOT, PROT, ALBUMIN in the last 168 hours. No results for input(s): LIPASE, AMYLASE in the last 168 hours. No results for input(s): AMMONIA in the last 168 hours.  CBC: Recent Labs  Lab 08/25/19 1022  WBC 13.0*  HGB 11.5*  HCT 37.5  MCV 101.1*  PLT 155    Cardiac Enzymes: No results for input(s): CKTOTAL, CKMB, CKMBINDEX, TROPONINI in the last 168 hours.  BNP (last 3 results) No results for input(s): BNP in the last 8760 hours.  ProBNP (last 3 results) No results for input(s): PROBNP in the last 8760 hours.  Radiological Exams: No  results found.  Assessment/Plan Active Problems:   Acute on chronic respiratory failure with hypoxia (HCC)   Severe sepsis (HCC)   Chronic atrial fibrillation (HCC)   Chronic diastolic heart failure of unknown etiology (HCC)   Alzheimer's dementia without behavioral disturbance (HCC)   1. Acute on chronic respiratory failure with hypoxia continue aerosol trach collar goal of 72 hours today.  Trach changed today to a #6 cuffless and start capping trials.  Continue supportive measures and pulmonary toilet. 2. Severe sepsis resolved hemodynamics are stable 3. Chronic atrial fibrillation rate controlled 4. Chronic diastolic heart failure no change 5. Alzheimer's dementia patient is at baseline we will continue to monitor   I have personally seen and evaluated the patient, evaluated laboratory and imaging results, formulated the assessment and plan and placed orders. The Patient requires high complexity decision making with multiple systems involvement.  Rounds were done with the Respiratory Therapy Director and Staff therapists and discussed with nursing staff also.  Yevonne Pax, MD [facility] Pulmonary Critical Care Medicine Sleep Medicine

## 2019-08-30 ENCOUNTER — Ambulatory Visit (HOSPITAL_COMMUNITY): Payer: Medicare Other | Attending: Cardiovascular Disease

## 2019-08-30 DIAGNOSIS — I482 Chronic atrial fibrillation, unspecified: Secondary | ICD-10-CM | POA: Diagnosis not present

## 2019-08-30 DIAGNOSIS — J90 Pleural effusion, not elsewhere classified: Secondary | ICD-10-CM | POA: Insufficient documentation

## 2019-08-30 DIAGNOSIS — I272 Pulmonary hypertension, unspecified: Secondary | ICD-10-CM | POA: Insufficient documentation

## 2019-08-30 DIAGNOSIS — I083 Combined rheumatic disorders of mitral, aortic and tricuspid valves: Secondary | ICD-10-CM | POA: Insufficient documentation

## 2019-08-30 DIAGNOSIS — I5031 Acute diastolic (congestive) heart failure: Secondary | ICD-10-CM | POA: Insufficient documentation

## 2019-08-30 DIAGNOSIS — R000 Tachycardia, unspecified: Secondary | ICD-10-CM | POA: Insufficient documentation

## 2019-08-30 DIAGNOSIS — I4891 Unspecified atrial fibrillation: Secondary | ICD-10-CM | POA: Insufficient documentation

## 2019-08-30 DIAGNOSIS — I5032 Chronic diastolic (congestive) heart failure: Secondary | ICD-10-CM | POA: Diagnosis not present

## 2019-08-30 DIAGNOSIS — J9621 Acute and chronic respiratory failure with hypoxia: Secondary | ICD-10-CM | POA: Diagnosis not present

## 2019-08-30 DIAGNOSIS — G309 Alzheimer's disease, unspecified: Secondary | ICD-10-CM | POA: Diagnosis not present

## 2019-08-30 LAB — CBC
HCT: 34.7 % — ABNORMAL LOW (ref 36.0–46.0)
Hemoglobin: 10.6 g/dL — ABNORMAL LOW (ref 12.0–15.0)
MCH: 30.8 pg (ref 26.0–34.0)
MCHC: 30.5 g/dL (ref 30.0–36.0)
MCV: 100.9 fL — ABNORMAL HIGH (ref 80.0–100.0)
Platelets: 149 10*3/uL — ABNORMAL LOW (ref 150–400)
RBC: 3.44 MIL/uL — ABNORMAL LOW (ref 3.87–5.11)
RDW: 17.8 % — ABNORMAL HIGH (ref 11.5–15.5)
WBC: 7.7 10*3/uL (ref 4.0–10.5)
nRBC: 0.8 % — ABNORMAL HIGH (ref 0.0–0.2)

## 2019-08-30 LAB — BASIC METABOLIC PANEL
Anion gap: 10 (ref 5–15)
BUN: 42 mg/dL — ABNORMAL HIGH (ref 8–23)
CO2: 29 mmol/L (ref 22–32)
Calcium: 8.6 mg/dL — ABNORMAL LOW (ref 8.9–10.3)
Chloride: 103 mmol/L (ref 98–111)
Creatinine, Ser: 0.62 mg/dL (ref 0.44–1.00)
GFR calc Af Amer: >60 mL/min (ref >60)
GFR calc non Af Amer: >60 mL/min (ref >60)
Glucose, Bld: 162 mg/dL — ABNORMAL HIGH (ref 70–99)
Potassium: 4.1 mmol/L (ref 3.5–5.1)
Sodium: 142 mmol/L (ref 135–145)

## 2019-08-30 NOTE — Consult Note (Signed)
Referring Physician: Dr. Sharyon Medicus REIGN DZIUBA is an 86 y.o. female.                       Chief Complaint: Atrial fibrillation with RVR  HPI: 86 years old female with acute on chronic respiratory failure has recurrent episodes of tachycardia. Patient has underlying atrial fibrillation treated with amiodarone and Eliquis. She has PMH of aspiration pneumonia, Congestive heart failure, Arthritis, GERD, pulmonary hypertension and Alzheimer disease. She is now on T collar with 28 % FiO2. Her last echocardiogram 2 years ago showed mild LV systolic dysfunction with 45 % EF with moderate MR and TR.  Past Medical History:  Diagnosis Date  . Acute on chronic respiratory failure with hypoxia ([place])   . Alzheimer's dementia without behavioral disturbance ([place])   . Chronic atrial fibrillation ([place])   . Chronic diastolic heart failure of unknown etiology ([place])   . Severe sepsis [facility])     Surgical history: Hysterectomy Appendectomy Pyloroplasty Esophageal dilatation Right Humerus fracture surgery -2017 Neck surgery - 2017 Peg tune placement-2017 Tracheostomy: 2021  The histories are not reviewed yet. Please review them in the "History" navigator section and refresh this [place].  Family history: positive for HTN.   Social History:  has no history tobacco use, no alcohol use and drug use.  Allergies:  Ativan, Haloperidol and ZZZquil causes confusion Melatonin causes prolonged drowsiness   No medications prior to admission.  See medication list in chart  Results for orders placed or performed during the hospital encounter of 08/15/19 (from the past 48 hour(s))  Basic metabolic panel     Status: Abnormal   Collection Time: 08/30/19  7:07 AM  Result Value Ref Range   Sodium 142 135 - 145 mmol/L   Potassium 4.1 3.5 - 5.1 mmol/L   Chloride 103 98 - 111 mmol/L   CO2 29 22 - 32 mmol/L   Glucose, Bld 162 (H) 70 - 99 mg/dL    Comment: Glucose reference range applies only to samples taken  after fasting for at least 8 hours.   BUN 42 (H) 8 - 23 mg/dL   Creatinine, Ser 0.62 0.44 - 1.00 mg/dL   Calcium 8.6 (L) 8.9 - 10.3 mg/dL   GFR calc non Af Amer >60 >60 mL/min   GFR calc Af Amer >60 >60 mL/min   Anion gap 10 5 - 15    Comment: Performed at [facility] [address] [place], [place] [address]  CBC     Status: Abnormal   Collection Time: 08/30/19  7:07 AM  Result Value Ref Range   WBC 7.7 4.0 - 10.5 K/uL   RBC 3.44 (L) 3.87 - 5.11 MIL/uL   Hemoglobin 10.6 (L) 12.0 - 15.0 g/dL   HCT 34.7 (L) 36.0 - 46.0 %   MCV 100.9 (H) 80.0 - 100.0 fL   MCH 30.8 26.0 - 34.0 pg   MCHC 30.5 30.0 - 36.0 g/dL   RDW 17.8 (H) 11.5 - 15.5 %   Platelets 149 (L) 150 - 400 K/uL   nRBC 0.8 (H) 0.0 - 0.2 %    Comment: Performed at [facility] [address] [place], [place] [address]   No results found.  Review Of Systems As per PMH. Patient unable to give history.  There were no vitals taken for this visit. There is no height or weight on file to calculate BMI. General appearance: alert, cooperative, appears stated age and  no distress Head: Normocephalic, atraumatic. Eyes: Blue eyes, pale pink conjunctiva, corneas clear. PERRL, EOM's intact. Neck: No adenopathy, no carotid bruit, no JVD, supple, symmetrical, T collar present. Resp: Crackles to auscultation bilaterally. Cardio: Irregular and rapid rate and rhythm, S1, S2 normal, III/VI systolic murmur, no click, rub. GI: Soft, non-tender; bowel sounds normal; no organomegaly. Extremities: 2 + lower leg edema, no cyanosis or clubbing. Skin: Warm and dry.  Neurologic: Alert and oriented X 1. Not following commands to move extremities  Assessment/Plan Acute on chronic systolic left heart failure Acute chronic respiratory failure with hypoxemia Chronic atrial fibrillation with recurrent RVR Type 2 DM H/O aspiration pneumonia H/O dementia  Add lanoxin for rate control. 2-D echocardiogram for LV and RV function and  evaluate MR and TR. Increase Metoprolol as tolerated.  Time spent: Review of old records, Lab, x-rays, EKG, other cardiac tests, examination, discussion with patient over 70 minutes.  Ricki Rodriguez, MD  08/30/2019, 11:32 AM

## 2019-08-30 NOTE — Progress Notes (Signed)
  Echocardiogram 2D Echocardiogram has been performed.  Tina Fuentes 08/30/2019, 2:56 PM

## 2019-08-30 NOTE — Progress Notes (Signed)
Pulmonary Critical Care Medicine [facility] [place]   PULMONARY CRITICAL CARE SERVICE  PROGRESS NOTE  Date of Service: 08/30/2019  Tina Fuentes  QQI:[id number]  DOB: 05-23-1929   DOA: 08/15/2019  Referring Physician: Carron Curie, MD  HPI: Tina Fuentes is a 86 y.o. female seen for follow up of Acute on Chronic Respiratory Failure.  Patient currently is on T collar has been on 28% FiO2 is doing really well we should be able to start with capping trials  Medications: Reviewed on Rounds  Physical Exam:  Vitals: Temperature is 97.8 pulse 96 respiratory rate 21 blood pressure is 145/81 saturations are 96%  Ventilator Settings on T collar with an FiO2 of 28%  . General: Comfortable at this time . Eyes: Grossly normal lids, irises & conjunctiva . ENT: grossly tongue is normal . Neck: no obvious mass . Cardiovascular: S1 S2 normal no gallop . Respiratory: Scattered rhonchi expansion is equal at this time . Abdomen: soft . Skin: no rash seen on limited exam . Musculoskeletal: not rigid . Psychiatric:unable to assess . Neurologic: no seizure no involuntary movements         Lab Data:   Basic Metabolic Panel: Recent Labs  Lab 08/25/19 1101 08/30/19 0707  NA 141 142  K 4.3 4.1  CL 104 103  CO2 26 29  GLUCOSE 162* 162*  BUN 44* 42*  CREATININE 0.61 0.62  CALCIUM 8.8* 8.6*    ABG: Recent Labs  Lab 08/27/19 0900  PHART 7.481*  PCO2ART 37.4  PO2ART 89.9  HCO3 27.6  O2SAT 97.8    Liver Function Tests: No results for input(s): AST, ALT, ALKPHOS, BILITOT, PROT, ALBUMIN in the last 168 hours. No results for input(s): LIPASE, AMYLASE in the last 168 hours. No results for input(s): AMMONIA in the last 168 hours.  CBC: Recent Labs  Lab 08/25/19 1022 08/30/19 0707  WBC 13.0* 7.7  HGB 11.5* 10.6*  HCT 37.5 34.7*  MCV 101.1* 100.9*  PLT 155 149*    Cardiac Enzymes: No results for input(s): CKTOTAL, CKMB, CKMBINDEX, TROPONINI in the last 168  hours.  BNP (last 3 results) No results for input(s): BNP in the last 8760 hours.  ProBNP (last 3 results) No results for input(s): PROBNP in the last 8760 hours.  Radiological Exams: No results found.  Assessment/Plan Active Problems:   Acute on chronic respiratory failure with hypoxia (HCC)   Severe sepsis (HCC)   Chronic atrial fibrillation (HCC)   Chronic diastolic heart failure of unknown etiology (HCC)   Alzheimer's dementia without behavioral disturbance (HCC)   1. Acute on chronic respiratory failure hypoxia we will continue with T collar trials begin capping trials 2. Severe sepsis resolved 3. Chronic atrial fibrillation rate controlled 4. Chronic diastolic heart failure right now is compensated 5. Alzheimer's dementia no change   I have personally seen and evaluated the patient, evaluated laboratory and imaging results, formulated the assessment and plan and placed orders. The Patient requires high complexity decision making with multiple systems involvement.  Rounds were done with the Respiratory Therapy Director and Staff therapists and discussed with nursing staff also.  Yevonne Pax, MD [facility] Pulmonary Critical Care Medicine Sleep Medicine

## 2019-08-31 DIAGNOSIS — J9621 Acute and chronic respiratory failure with hypoxia: Secondary | ICD-10-CM | POA: Diagnosis not present

## 2019-08-31 DIAGNOSIS — I482 Chronic atrial fibrillation, unspecified: Secondary | ICD-10-CM | POA: Diagnosis not present

## 2019-08-31 DIAGNOSIS — G309 Alzheimer's disease, unspecified: Secondary | ICD-10-CM | POA: Diagnosis not present

## 2019-08-31 DIAGNOSIS — I5032 Chronic diastolic (congestive) heart failure: Secondary | ICD-10-CM | POA: Diagnosis not present

## 2019-08-31 NOTE — Progress Notes (Addendum)
Pulmonary Critical Care Medicine [facility] [place]   PULMONARY CRITICAL CARE SERVICE  PROGRESS NOTE  Date of Service: 08/31/2019  Tina Fuentes  QMG:[id number]  DOB: 10/13/1928   DOA: 08/15/2019  Referring Physician: Carron Curie, MD  HPI: Tina Fuentes is a 86 y.o. female seen for follow up of Acute on Chronic Respiratory Failure.  Patient continues on 35% aerosol trach collar satting well no fever distress.  Medications: Reviewed on Rounds  Physical Exam:  Vitals: Pulse 92 respirations 30 BP 155/84 O2 sat 96% temp 96.6  Ventilator Settings 35% ATC  . General: Comfortable at this time . Eyes: Grossly normal lids, irises & conjunctiva . ENT: grossly tongue is normal . Neck: no obvious mass . Cardiovascular: S1 S2 normal no gallop . Respiratory: No rales or rhonchi noted . Abdomen: soft . Skin: no rash seen on limited exam . Musculoskeletal: not rigid . Psychiatric:unable to assess . Neurologic: no seizure no involuntary movements         Lab Data:   Basic Metabolic Panel: Recent Labs  Lab 08/25/19 1101 08/30/19 0707  NA 141 142  K 4.3 4.1  CL 104 103  CO2 26 29  GLUCOSE 162* 162*  BUN 44* 42*  CREATININE 0.61 0.62  CALCIUM 8.8* 8.6*    ABG: Recent Labs  Lab 08/27/19 0900  PHART 7.481*  PCO2ART 37.4  PO2ART 89.9  HCO3 27.6  O2SAT 97.8    Liver Function Tests: No results for input(s): AST, ALT, ALKPHOS, BILITOT, PROT, ALBUMIN in the last 168 hours. No results for input(s): LIPASE, AMYLASE in the last 168 hours. No results for input(s): AMMONIA in the last 168 hours.  CBC: Recent Labs  Lab 08/25/19 1022 08/30/19 0707  WBC 13.0* 7.7  HGB 11.5* 10.6*  HCT 37.5 34.7*  MCV 101.1* 100.9*  PLT 155 149*    Cardiac Enzymes: No results for input(s): CKTOTAL, CKMB, CKMBINDEX, TROPONINI in the last 168 hours.  BNP (last 3 results) No results for input(s): BNP in the last 8760 hours.  ProBNP (last 3 results) No results for input(s):  PROBNP in the last 8760 hours.  Radiological Exams: ECHOCARDIOGRAM COMPLETE  Result Date: 08/30/2019    ECHOCARDIOGRAM REPORT   Patient Name:   Tina Fuentes Date of Exam: 08/30/2019 Medical Rec #:  [id number]    Height: Accession #:    [id number]   Weight: Date of Birth:  17-Jun-1929   BSA: Patient Age:    89 years     BP:           145/81 mmHg Patient Gender: F            HR:           102 bpm. Exam Location:  Inpatient Procedure: 2D Echo, Cardiac Doppler and Color Doppler Indications:     CHF-Acute Diastolic 428.31/I50.31  History:         Patient has no prior history of Echocardiogram examinations.                  Pulmonary HTN; Arrythmias:Atrial Fibrillation. Tachycardia.                  Sepsis.  Sonographer:     Ross Ludwig RDCS (AE) Referring Phys:  [id number] Orpah Cobb Diagnosing Phys: Orpah Cobb MD IMPRESSIONS  1. Left ventricular ejection fraction, by estimation, is 50 to 55%. The left ventricle has mildly decreased function. The left ventricle demonstrates regional wall motion abnormalities (see scoring  diagram/findings for description). There is mild concentric left ventricular hypertrophy. Indeterminate diastolic filling due to E-A fusion.  2. Right ventricular systolic function is mildly reduced. The right ventricular size is mildly enlarged. There is severely elevated pulmonary artery systolic pressure. The estimated right ventricular systolic pressure is 78.2 mmHg.  3. Left atrial size was severely dilated.  4. Right atrial size was severely dilated.  5. Can not esclude MV vegetation. The mitral valve is degenerative. Moderate to severe mitral valve regurgitation. Mild mitral stenosis.  6. The tricuspid valve is degenerative. Tricuspid valve regurgitation is severe.  7. The aortic valve is tricuspid. Aortic valve regurgitation is mild to moderate. Mild aortic valve stenosis.  8. There is mild (Grade II) atheroma plaque involving the aortic root and ascending aorta.  9. The inferior vena cava is  normal in size with <50% respiratory variability, suggesting right atrial pressure of 8 mmHg. FINDINGS  Left Ventricle: Left ventricular ejection fraction, by estimation, is 50 to 55%. The left ventricle has mildly decreased function. The left ventricle demonstrates regional wall motion abnormalities. Severe hypokinesis of the left ventricular, basal-mid anteroseptal wall. The left ventricular internal cavity size was normal in size. There is mild concentric left ventricular hypertrophy. Indeterminate diastolic filling due to E-A fusion.  LV Wall Scoring: The anterior septum, mid inferoseptal segment, and basal inferoseptal segment are hypokinetic. The entire anterior wall, entire lateral wall, entire apex, and entire inferior wall are normal. Right Ventricle: The right ventricular size is mildly enlarged. No increase in right ventricular wall thickness. Right ventricular systolic function is mildly reduced. There is severely elevated pulmonary artery systolic pressure. The tricuspid regurgitant velocity is 4.45 m/s, and with an assumed right atrial pressure of 8 mmHg, the estimated right ventricular systolic pressure is 42.3 mmHg. Left Atrium: Left atrial size was severely dilated. Right Atrium: Right atrial size was severely dilated. Pericardium: Trivial pericardial effusion is present. The pericardial effusion is circumferential. Mitral Valve: Can not esclude MV vegetation. The mitral valve is degenerative in appearance. There is moderate thickening of the mitral valve leaflet(s). There is moderate calcification of the mitral valve leaflet(s). Moderate mitral annular calcification. Moderate to severe mitral valve regurgitation, with centrally-directed jet. Mild mitral valve stenosis. Tricuspid Valve: The tricuspid valve is degenerative in appearance. Tricuspid valve regurgitation is severe. No evidence of tricuspid stenosis. Aortic Valve: The aortic valve is tricuspid. . There is moderate thickening and moderate  calcification of the aortic valve. Aortic valve regurgitation is mild to moderate. Aortic regurgitation PHT measures 232 msec. Mild aortic stenosis is present. Moderate aortic valve annular calcification. There is moderate thickening of the aortic valve. There is moderate calcification of the aortic valve. Pulmonic Valve: The pulmonic valve was normal in structure. Pulmonic valve regurgitation is mild to moderate. Aorta: The aortic root is normal in size and structure. There is mild (Grade II) atheroma plaque involving the aortic root and ascending aorta. Venous: The inferior vena cava is normal in size with less than 50% respiratory variability, suggesting right atrial pressure of 8 mmHg. IAS/Shunts: No atrial level shunt detected by color flow Doppler. Additional Comments: There is a small pleural effusion in the left lateral region.  LEFT VENTRICLE PLAX 2D LVIDd:         4.70 cm LVIDs:         3.26 cm LV PW:         1.31 cm LV IVS:        1.25 cm LVOT diam:  2.20 cm LV SV:         42 LVOT Area:     3.80 cm  RIGHT VENTRICLE            IVC RV Basal diam:  3.20 cm    IVC diam: 1.90 cm RV S prime:     5.25 cm/s TAPSE (M-mode): 1.0 cm LEFT ATRIUM            RIGHT ATRIUM LA diam:      5.70 cm  RA Area:     40.00 cm LA Vol (A2C): 146.0 ml RA Volume:   174.00 ml LA Vol (A4C): 102.0 ml  AORTIC VALVE LVOT Vmax:   73.52 cm/s LVOT Vmean:  45.000 cm/s LVOT VTI:    0.110 m AI PHT:      232 msec  AORTA Ao Root diam: 3.00 cm MR Peak grad:    114.9 mmHg  TRICUSPID VALVE MR Mean grad:    76.0 mmHg   TR Peak grad:   79.2 mmHg MR Vmax:         536.00 cm/s TR Vmax:        445.00 cm/s MR Vmean:        413.0 cm/s MR PISA:         4.02 cm    SHUNTS MR PISA Eff ROA: 23 mm      Systemic VTI:  0.11 m MR PISA Radius:  0.80 cm     Systemic Diam: 2.20 cm Orpah Cobb MD Electronically signed by Orpah Cobb MD Signature Date/Time: 08/30/2019/5:55:05 PM    Final     Assessment/Plan Active Problems:   Acute on chronic respiratory  failure with hypoxia (HCC)   Severe sepsis (HCC)   Chronic atrial fibrillation (HCC)   Chronic diastolic heart failure of unknown etiology (HCC)   Alzheimer's dementia without behavioral disturbance (HCC)   1. Acute on chronic respiratory failure hypoxia we will continue with T collar trials begin capping trials 2. Severe sepsis resolved 3. Chronic atrial fibrillation rate controlled 4. Chronic diastolic heart failure right now is compensated 5. Alzheimer's dementia no change   I have personally seen and evaluated the patient, evaluated laboratory and imaging results, formulated the assessment and plan and placed orders. The Patient requires high complexity decision making with multiple systems involvement.  Rounds were done with the Respiratory Therapy Director and Staff therapists and discussed with nursing staff also.  Yevonne Pax, MD [facility] Pulmonary Critical Care Medicine Sleep Medicine

## 2019-09-01 DIAGNOSIS — I482 Chronic atrial fibrillation, unspecified: Secondary | ICD-10-CM | POA: Diagnosis not present

## 2019-09-01 DIAGNOSIS — G309 Alzheimer's disease, unspecified: Secondary | ICD-10-CM | POA: Diagnosis not present

## 2019-09-01 DIAGNOSIS — J9621 Acute and chronic respiratory failure with hypoxia: Secondary | ICD-10-CM | POA: Diagnosis not present

## 2019-09-01 DIAGNOSIS — I5032 Chronic diastolic (congestive) heart failure: Secondary | ICD-10-CM | POA: Diagnosis not present

## 2019-09-01 NOTE — Progress Notes (Addendum)
Pulmonary Critical Care Medicine [facility] [place]   PULMONARY CRITICAL CARE SERVICE  PROGRESS NOTE  Date of Service: 09/01/2019  Tina Fuentes  YFV:[id number]  DOB: [birth year]   DOA: 08/15/2019  Referring Physician: Carron Curie, MD  HPI: Tina Fuentes is a 86 y.o. female seen for follow up of Acute on Chronic Respiratory Failure.  The patient is currently on T collar has been on 28% FiO2 with good saturations  Medications: Reviewed on Rounds  Physical Exam:  Vitals: Temperature is 97.8 pulse 94 respiratory rate 20 blood pressure is 134/74 saturations 98%  Ventilator Settings on T collar with an FiO2 of 28%  . General: Comfortable at this time . Eyes: Grossly normal lids, irises & conjunctiva . ENT: grossly tongue is normal . Neck: no obvious mass . Cardiovascular: S1 S2 normal no gallop . Respiratory: No rhonchi coarse breath sounds . Abdomen: soft . Skin: no rash seen on limited exam . Musculoskeletal: not rigid . Psychiatric:unable to assess . Neurologic: no seizure no involuntary movements         Lab Data:   Basic Metabolic Panel: Recent Labs  Lab 08/30/19 0707  NA 142  K 4.1  CL 103  CO2 29  GLUCOSE 162*  BUN 42*  CREATININE 0.62  CALCIUM 8.6*    ABG: Recent Labs  Lab 08/27/19 0900  PHART 7.481*  PCO2ART 37.4  PO2ART 89.9  HCO3 27.6  O2SAT 97.8    Liver Function Tests: No results for input(s): AST, ALT, ALKPHOS, BILITOT, PROT, ALBUMIN in the last 168 hours. No results for input(s): LIPASE, AMYLASE in the last 168 hours. No results for input(s): AMMONIA in the last 168 hours.  CBC: Recent Labs  Lab 08/30/19 0707  WBC 7.7  HGB 10.6*  HCT 34.7*  MCV 100.9*  PLT 149*    Cardiac Enzymes: No results for input(s): CKTOTAL, CKMB, CKMBINDEX, TROPONINI in the last 168 hours.  BNP (last 3 results) No results for input(s): BNP in the last 8760 hours.  ProBNP (last 3 results) No results for input(s): PROBNP in the last 8760  hours.  Radiological Exams: No results found.  Assessment/Plan Active Problems:   Acute on chronic respiratory failure with hypoxia (HCC)   Severe sepsis (HCC)   Chronic atrial fibrillation (HCC)   Chronic diastolic heart failure of unknown etiology (HCC)   Alzheimer's dementia without behavioral disturbance (HCC)   1. Acute on chronic respiratory failure with hypoxia continue with T collar trials titrate oxygen continue pulmonary toilet 2. Severe sepsis hemodynamics are stable 3. Chronic atrial fibrillation rate is controlled 4. Chronic diastolic heart failure compensated 5. Alzheimer's dementia no changes noted   I have personally seen and evaluated the patient, evaluated laboratory and imaging results, formulated the assessment and plan and placed orders. The Patient requires high complexity decision making with multiple systems involvement.  Rounds were done with the Respiratory Therapy Director and Staff therapists and discussed with nursing staff also.  Yevonne Pax, MD [facility] Pulmonary Critical Care Medicine Sleep Medicine

## 2019-09-02 DIAGNOSIS — J9621 Acute and chronic respiratory failure with hypoxia: Secondary | ICD-10-CM | POA: Diagnosis not present

## 2019-09-02 DIAGNOSIS — I5032 Chronic diastolic (congestive) heart failure: Secondary | ICD-10-CM | POA: Diagnosis not present

## 2019-09-02 DIAGNOSIS — I482 Chronic atrial fibrillation, unspecified: Secondary | ICD-10-CM | POA: Diagnosis not present

## 2019-09-02 DIAGNOSIS — G309 Alzheimer's disease, unspecified: Secondary | ICD-10-CM | POA: Diagnosis not present

## 2019-09-02 NOTE — Progress Notes (Signed)
Pulmonary Critical Care Medicine [facility] [place]   PULMONARY CRITICAL CARE SERVICE  PROGRESS NOTE  Date of Service: 09/02/2019  Tina Fuentes  OEU:[id number]  DOB: 02/08/1929   DOA: 08/15/2019  Referring Physician: Carron Curie, MD  HPI: Tina Fuentes is a 86 y.o. female seen for follow up of Acute on Chronic Respiratory Failure.  Patient is on T collar on 28% FiO2 she is doing little bit better has been having issues with secretions but improving  Medications: Reviewed on Rounds  Physical Exam:  Vitals: Temperature is 96.8 pulse 82 respiratory rate 24 blood pressure is 135/74 saturations 97%  Ventilator Settings off the ventilator on T collar FiO2 28%  . General: Comfortable at this time . Eyes: Grossly normal lids, irises & conjunctiva . ENT: grossly tongue is normal . Neck: no obvious mass . Cardiovascular: S1 S2 normal no gallop . Respiratory: No rhonchi coarse breath sounds are noted . Abdomen: soft . Skin: no rash seen on limited exam . Musculoskeletal: not rigid . Psychiatric:unable to assess . Neurologic: no seizure no involuntary movements         Lab Data:   Basic Metabolic Panel: Recent Labs  Lab 08/30/19 0707  NA 142  K 4.1  CL 103  CO2 29  GLUCOSE 162*  BUN 42*  CREATININE 0.62  CALCIUM 8.6*    ABG: Recent Labs  Lab 08/27/19 0900  PHART 7.481*  PCO2ART 37.4  PO2ART 89.9  HCO3 27.6  O2SAT 97.8    Liver Function Tests: No results for input(s): AST, ALT, ALKPHOS, BILITOT, PROT, ALBUMIN in the last 168 hours. No results for input(s): LIPASE, AMYLASE in the last 168 hours. No results for input(s): AMMONIA in the last 168 hours.  CBC: Recent Labs  Lab 08/30/19 0707  WBC 7.7  HGB 10.6*  HCT 34.7*  MCV 100.9*  PLT 149*    Cardiac Enzymes: No results for input(s): CKTOTAL, CKMB, CKMBINDEX, TROPONINI in the last 168 hours.  BNP (last 3 results) No results for input(s): BNP in the last 8760 hours.  ProBNP (last 3  results) No results for input(s): PROBNP in the last 8760 hours.  Radiological Exams: No results found.  Assessment/Plan Active Problems:   Acute on chronic respiratory failure with hypoxia (HCC)   Severe sepsis (HCC)   Chronic atrial fibrillation (HCC)   Chronic diastolic heart failure of unknown etiology (HCC)   Alzheimer's dementia without behavioral disturbance (HCC)   1. Acute on chronic respiratory failure with hypoxia we will continue with T collar trials continue pulmonary toilet supportive care 2. Severe sepsis hemodynamics are stable we will continue to follow 3. Chronic atrial fibrillation rate controlled 4. Chronic diastolic heart failure compensated 5. Alzheimer's dementia no improvement changes noted   I have personally seen and evaluated the patient, evaluated laboratory and imaging results, formulated the assessment and plan and placed orders. The Patient requires high complexity decision making with multiple systems involvement.  Rounds were done with the Respiratory Therapy Director and Staff therapists and discussed with nursing staff also.  Yevonne Pax, MD [facility] Pulmonary Critical Care Medicine Sleep Medicine

## 2019-09-03 DIAGNOSIS — J9621 Acute and chronic respiratory failure with hypoxia: Secondary | ICD-10-CM | POA: Diagnosis not present

## 2019-09-03 DIAGNOSIS — I5032 Chronic diastolic (congestive) heart failure: Secondary | ICD-10-CM | POA: Diagnosis not present

## 2019-09-03 DIAGNOSIS — G309 Alzheimer's disease, unspecified: Secondary | ICD-10-CM | POA: Diagnosis not present

## 2019-09-03 DIAGNOSIS — I482 Chronic atrial fibrillation, unspecified: Secondary | ICD-10-CM | POA: Diagnosis not present

## 2019-09-03 LAB — CULTURE, RESPIRATORY W GRAM STAIN

## 2019-09-03 NOTE — Progress Notes (Signed)
Pulmonary Critical Care Medicine [facility] [place]   PULMONARY CRITICAL CARE SERVICE  PROGRESS NOTE  Date of Service: 09/03/2019  Tina Fuentes  [id number]:[id number]  DOB: October 18, 1928   DOA: 08/15/2019  Referring Physician: Carron Curie, MD  HPI: Tina Fuentes is a 86 y.o. female seen for follow up of Acute on Chronic Respiratory Failure.  Patient is having increase in secretions reportedly.  The patient's on 28% FiO2 at this time.  On her echocardiogram she was noted to have severe pulmonary arterial hypertension.  It may be a consideration to treat this will discuss with the primary care team  Medications: Reviewed on Rounds  Physical Exam:  Vitals: Temperature is 96.9 pulse 77 respiratory 22 blood pressure is 155/72 saturations 97%  Ventilator Settings off the ventilator on T collar with an FiO2 of 28%  . General: Comfortable at this time . Eyes: Grossly normal lids, irises & conjunctiva . ENT: grossly tongue is normal . Neck: no obvious mass . Cardiovascular: S1 S2 normal no gallop . Respiratory: Scattered rhonchi expansion is equal . Abdomen: soft . Skin: no rash seen on limited exam . Musculoskeletal: not rigid . Psychiatric:unable to assess . Neurologic: no seizure no involuntary movements         Lab Data:   Basic Metabolic Panel: Recent Labs  Lab 08/30/19 0707  NA 142  K 4.1  CL 103  CO2 29  GLUCOSE 162*  BUN 42*  CREATININE 0.62  CALCIUM 8.6*    ABG: Recent Labs  Lab 08/27/19 0900  PHART 7.481*  PCO2ART 37.4  PO2ART 89.9  HCO3 27.6  O2SAT 97.8    Liver Function Tests: No results for input(s): AST, ALT, ALKPHOS, BILITOT, PROT, ALBUMIN in the last 168 hours. No results for input(s): LIPASE, AMYLASE in the last 168 hours. No results for input(s): AMMONIA in the last 168 hours.  CBC: Recent Labs  Lab 08/30/19 0707  WBC 7.7  HGB 10.6*  HCT 34.7*  MCV 100.9*  PLT 149*    Cardiac Enzymes: No results for input(s): CKTOTAL, CKMB,  CKMBINDEX, TROPONINI in the last 168 hours.  BNP (last 3 results) No results for input(s): BNP in the last 8760 hours.  ProBNP (last 3 results) No results for input(s): PROBNP in the last 8760 hours.  Radiological Exams: No results found.  Assessment/Plan Active Problems:   Acute on chronic respiratory failure with hypoxia (HCC)   Severe sepsis (HCC)   Chronic atrial fibrillation (HCC)   Chronic diastolic heart failure of unknown etiology (HCC)   Alzheimer's dementia without behavioral disturbance (HCC)   1. Acute on chronic respiratory failure hypoxia she is doing fine on T collar however secretions are significantly increased.  In addition as already noted patient has significant pulmonary hypertension based on the echocardiogram.  Right now not a candidate for right and left heart cath however in reviewing the echo with consideration for treatment of the PA pressures it may be of some help however there are complicating factors such as presence of a Alzheimer's dementia as well as the heart failure. 2. Chronic atrial fibrillation rate now rate controlled we will continue with supportive care 3. Chronic diastolic heart failure monitor fluid status closely. 4. Alzheimer's dementia no change 5. Severe sepsis this has resolved   I have personally seen and evaluated the patient, evaluated laboratory and imaging results, formulated the assessment and plan and placed orders. The Patient requires high complexity decision making with multiple systems involvement.  Rounds were done  with the Respiratory Therapy Director and Staff therapists and discussed with nursing staff also.  Allyne Gee, MD [facility] Pulmonary Critical Care Medicine Sleep Medicine

## 2019-09-04 DIAGNOSIS — G309 Alzheimer's disease, unspecified: Secondary | ICD-10-CM | POA: Diagnosis not present

## 2019-09-04 DIAGNOSIS — J9621 Acute and chronic respiratory failure with hypoxia: Secondary | ICD-10-CM | POA: Diagnosis not present

## 2019-09-04 DIAGNOSIS — I5032 Chronic diastolic (congestive) heart failure: Secondary | ICD-10-CM | POA: Diagnosis not present

## 2019-09-04 DIAGNOSIS — I482 Chronic atrial fibrillation, unspecified: Secondary | ICD-10-CM | POA: Diagnosis not present

## 2019-09-04 LAB — CBC
HCT: 34.5 % — ABNORMAL LOW (ref 36.0–46.0)
Hemoglobin: 10.6 g/dL — ABNORMAL LOW (ref 12.0–15.0)
MCH: 30.7 pg (ref 26.0–34.0)
MCHC: 30.7 g/dL (ref 30.0–36.0)
MCV: 100 fL (ref 80.0–100.0)
Platelets: 195 10*3/uL (ref 150–400)
RBC: 3.45 MIL/uL — ABNORMAL LOW (ref 3.87–5.11)
RDW: 17.5 % — ABNORMAL HIGH (ref 11.5–15.5)
WBC: 5.8 10*3/uL (ref 4.0–10.5)
nRBC: 0 % (ref 0.0–0.2)

## 2019-09-04 LAB — BASIC METABOLIC PANEL
Anion gap: 11 (ref 5–15)
BUN: 24 mg/dL — ABNORMAL HIGH (ref 8–23)
CO2: 30 mmol/L (ref 22–32)
Calcium: 8.4 mg/dL — ABNORMAL LOW (ref 8.9–10.3)
Chloride: 99 mmol/L (ref 98–111)
Creatinine, Ser: 0.48 mg/dL (ref 0.44–1.00)
GFR calc Af Amer: >60 mL/min (ref >60)
GFR calc non Af Amer: >60 mL/min (ref >60)
Glucose, Bld: 130 mg/dL — ABNORMAL HIGH (ref 70–99)
Potassium: 3.7 mmol/L (ref 3.5–5.1)
Sodium: 140 mmol/L (ref 135–145)

## 2019-09-04 NOTE — Progress Notes (Signed)
Pulmonary Critical Care Medicine [place]   PULMONARY CRITICAL CARE SERVICE  PROGRESS NOTE  Date of Service: 09/04/2019  Tina Fuentes  FUX:[id number]  DOB: February 04, 1929   DOA: 08/15/2019  Referring Physician: Merton Border, MD  HPI: Tina Fuentes is a 86 y.o. female seen for follow up of Acute on Chronic Respiratory Failure.  Patient right now is on T collar has been on 28% FiO2 remains with copious amounts of secretions.  The son wanted to talk to me I called there was no answer left a message we will happily discussed with him regarding the patient's status.  Medications: Reviewed on Rounds  Physical Exam:  Vitals: Temperature is 97.1 pulse 70 respiratory 26 blood pressure is 143/78 saturations 100%  Ventilator Settings on T collar with an FiO2 of 28%  . General: Comfortable at this time . Eyes: Grossly normal lids, irises & conjunctiva . ENT: grossly tongue is normal . Neck: no obvious mass . Cardiovascular: S1 S2 normal no gallop . Respiratory: Coarse breath sounds with few scattered rhonchi . Abdomen: soft . Skin: no rash seen on limited exam . Musculoskeletal: not rigid . Psychiatric:unable to assess . Neurologic: no seizure no involuntary movements         Lab Data:   Basic Metabolic Panel: Recent Labs  Lab 08/30/19 0707 09/04/19 0425  NA 142 140  K 4.1 3.7  CL 103 99  CO2 29 30  GLUCOSE 162* 130*  BUN 42* 24*  CREATININE 0.62 0.48  CALCIUM 8.6* 8.4*    ABG: No results for input(s): PHART, PCO2ART, PO2ART, HCO3, O2SAT in the last 168 hours.  Liver Function Tests: No results for input(s): AST, ALT, ALKPHOS, BILITOT, PROT, ALBUMIN in the last 168 hours. No results for input(s): LIPASE, AMYLASE in the last 168 hours. No results for input(s): AMMONIA in the last 168 hours.  CBC: Recent Labs  Lab 08/30/19 0707 09/04/19 0425  WBC 7.7 5.8  HGB 10.6* 10.6*  HCT 34.7* 34.5*  MCV 100.9* 100.0  PLT 149* 195    Cardiac Enzymes: No  results for input(s): CKTOTAL, CKMB, CKMBINDEX, TROPONINI in the last 168 hours.  BNP (last 3 results) No results for input(s): BNP in the last 8760 hours.  ProBNP (last 3 results) No results for input(s): PROBNP in the last 8760 hours.  Radiological Exams: No results found.  Assessment/Plan Active Problems:   Acute on chronic respiratory failure with hypoxia (HCC)   Severe sepsis (HCC)   Chronic atrial fibrillation (HCC)   Chronic diastolic heart failure of unknown etiology ([place])   Alzheimer's dementia without behavioral disturbance ([place])   1. Acute on chronic respiratory failure with hypoxia at this time patient is going to continue with T collar trials patient secretions remain the main issue right now as far as being able to clear the secretions and help with secretions. 2. Severe sepsis this is resolved hemodynamics are stable. 3. Chronic atrial fibrillation right now rate controlled 4. Chronic diastolic heart failure cardiology is following along. 5. Pulmonary hypertension noted on the echocardiogram patient has severe pulmonary pressure elevation based on her cardiopulmonary status she is not a candidate for safe use of medications at this point 6. Alzheimer's dementia no improvement no change we will continue to monitor   I have personally seen and evaluated the patient, evaluated laboratory and imaging results, formulated the assessment and plan and placed orders. The Patient requires high complexity decision making with multiple systems involvement.  Rounds were done  with the Respiratory Therapy Director and Staff therapists and discussed with nursing staff also.  Yevonne Pax, MD [facility] Pulmonary Critical Care Medicine Sleep Medicine

## 2019-09-05 DIAGNOSIS — G309 Alzheimer's disease, unspecified: Secondary | ICD-10-CM | POA: Diagnosis not present

## 2019-09-05 DIAGNOSIS — J9621 Acute and chronic respiratory failure with hypoxia: Secondary | ICD-10-CM | POA: Diagnosis not present

## 2019-09-05 DIAGNOSIS — I482 Chronic atrial fibrillation, unspecified: Secondary | ICD-10-CM | POA: Diagnosis not present

## 2019-09-05 DIAGNOSIS — I5032 Chronic diastolic (congestive) heart failure: Secondary | ICD-10-CM | POA: Diagnosis not present

## 2019-09-05 NOTE — Progress Notes (Signed)
Pulmonary Critical Care Medicine [facility] [place]   PULMONARY CRITICAL CARE SERVICE  PROGRESS NOTE  Date of Service: 09/05/2019  Tina Fuentes  RSW:[id number]  DOB: 30-May-1929   DOA: 08/15/2019  Referring Physician: Carron Curie, MD  HPI: Tina Fuentes is a 86 y.o. female seen for follow up of Acute on Chronic Respiratory Failure.  Patient is on T collar 28% FiO2 still has some issues with secretions right now is using the PMV  Medications: Reviewed on Rounds  Physical Exam:  Vitals: Temperature is 96.8 pulse 71 respiratory rate is 29 blood pressure is 129/73 saturations 97%  Ventilator Settings on T collar with an FiO2 of 28%  . General: Comfortable at this time . Eyes: Grossly normal lids, irises & conjunctiva . ENT: grossly tongue is normal . Neck: no obvious mass . Cardiovascular: S1 S2 normal no gallop . Respiratory: No rhonchi coarse breath sounds . Abdomen: soft . Skin: no rash seen on limited exam . Musculoskeletal: not rigid . Psychiatric:unable to assess . Neurologic: no seizure no involuntary movements         Lab Data:   Basic Metabolic Panel: Recent Labs  Lab 08/30/19 0707 09/04/19 0425  NA 142 140  K 4.1 3.7  CL 103 99  CO2 29 30  GLUCOSE 162* 130*  BUN 42* 24*  CREATININE 0.62 0.48  CALCIUM 8.6* 8.4*    ABG: No results for input(s): PHART, PCO2ART, PO2ART, HCO3, O2SAT in the last 168 hours.  Liver Function Tests: No results for input(s): AST, ALT, ALKPHOS, BILITOT, PROT, ALBUMIN in the last 168 hours. No results for input(s): LIPASE, AMYLASE in the last 168 hours. No results for input(s): AMMONIA in the last 168 hours.  CBC: Recent Labs  Lab 08/30/19 0707 09/04/19 0425  WBC 7.7 5.8  HGB 10.6* 10.6*  HCT 34.7* 34.5*  MCV 100.9* 100.0  PLT 149* 195    Cardiac Enzymes: No results for input(s): CKTOTAL, CKMB, CKMBINDEX, TROPONINI in the last 168 hours.  BNP (last 3 results) No results for input(s): BNP in the last 8760  hours.  ProBNP (last 3 results) No results for input(s): PROBNP in the last 8760 hours.  Radiological Exams: No results found.  Assessment/Plan Active Problems:   Acute on chronic respiratory failure with hypoxia (HCC)   Severe sepsis (HCC)   Chronic atrial fibrillation (HCC)   Chronic diastolic heart failure of unknown etiology (HCC)   Alzheimer's dementia without behavioral disturbance (HCC)   1. Acute on chronic respiratory failure with hypoxia we will continue with T collar trials titrate oxygen continue pulmonary toilet 2. Severe sepsis hemodynamics are stable 3. Chronic atrial fibrillation rate controlled at this time 4. Chronic diastolic heart failure right now is compensated 5. Alzheimer's dementia no change we will continue to follow   I have personally seen and evaluated the patient, evaluated laboratory and imaging results, formulated the assessment and plan and placed orders. The Patient requires high complexity decision making with multiple systems involvement.  Rounds were done with the Respiratory Therapy Director and Staff therapists and discussed with nursing staff also.  Yevonne Pax, MD [facility] Pulmonary Critical Care Medicine Sleep Medicine

## 2019-09-06 DIAGNOSIS — I5032 Chronic diastolic (congestive) heart failure: Secondary | ICD-10-CM | POA: Diagnosis not present

## 2019-09-06 DIAGNOSIS — I482 Chronic atrial fibrillation, unspecified: Secondary | ICD-10-CM | POA: Diagnosis not present

## 2019-09-06 DIAGNOSIS — G309 Alzheimer's disease, unspecified: Secondary | ICD-10-CM | POA: Diagnosis not present

## 2019-09-06 DIAGNOSIS — J9621 Acute and chronic respiratory failure with hypoxia: Secondary | ICD-10-CM | POA: Diagnosis not present

## 2019-09-06 NOTE — Progress Notes (Addendum)
Pulmonary Critical Care Medicine [facility] [place]   PULMONARY CRITICAL CARE SERVICE  PROGRESS NOTE  Date of Service: 09/06/2019  Tina Fuentes  [id number]:[id number]  DOB: 1929/03/22   DOA: 08/15/2019  Referring Physician: Carron Curie, MD  HPI: Tina Fuentes is a 86 y.o. female seen for follow up of Acute on Chronic Respiratory Failure.  Patient continues on aerosol trach collar 28% FiO2 using PMV with no difficulty.  Medications: Reviewed on Rounds  Physical Exam:  Vitals: Pulse 72 respirations 20 BP 124/65 O2 sat 99% temp 97.7  Ventilator Settings ATC 28%  . General: Comfortable at this time . Eyes: Grossly normal lids, irises & conjunctiva . ENT: grossly tongue is normal . Neck: no obvious mass . Cardiovascular: S1 S2 normal no gallop . Respiratory: No rales or rhonchi noted . Abdomen: soft . Skin: no rash seen on limited exam . Musculoskeletal: not rigid . Psychiatric:unable to assess . Neurologic: no seizure no involuntary movements         Lab Data:   Basic Metabolic Panel: Recent Labs  Lab 09/04/19 0425  NA 140  K 3.7  CL 99  CO2 30  GLUCOSE 130*  BUN 24*  CREATININE 0.48  CALCIUM 8.4*    ABG: No results for input(s): PHART, PCO2ART, PO2ART, HCO3, O2SAT in the last 168 hours.  Liver Function Tests: No results for input(s): AST, ALT, ALKPHOS, BILITOT, PROT, ALBUMIN in the last 168 hours. No results for input(s): LIPASE, AMYLASE in the last 168 hours. No results for input(s): AMMONIA in the last 168 hours.  CBC: Recent Labs  Lab 09/04/19 0425  WBC 5.8  HGB 10.6*  HCT 34.5*  MCV 100.0  PLT 195    Cardiac Enzymes: No results for input(s): CKTOTAL, CKMB, CKMBINDEX, TROPONINI in the last 168 hours.  BNP (last 3 results) No results for input(s): BNP in the last 8760 hours.  ProBNP (last 3 results) No results for input(s): PROBNP in the last 8760 hours.  Radiological Exams: No results found.  Assessment/Plan Active Problems:    Acute on chronic respiratory failure with hypoxia (HCC)   Severe sepsis (HCC)   Chronic atrial fibrillation (HCC)   Chronic diastolic heart failure of unknown etiology (HCC)   Alzheimer's dementia without behavioral disturbance (HCC)   1. Acute on chronic respiratory failure with hypoxia patient will continue to wean on aerosol trach collar 28% FiO2 using PMV.  Continue aggressive pulmonary toilet and supportive measures. 2. Severe sepsis hemodynamics are stable 3. Chronic atrial fibrillation rate controlled at this time 4. Chronic diastolic heart failure right now is compensated 5. Alzheimer's dementia no change we will continue to follow   I have personally seen and evaluated the patient, evaluated laboratory and imaging results, formulated the assessment and plan and placed orders. The Patient requires high complexity decision making with multiple systems involvement.  Rounds were done with the Respiratory Therapy Director and Staff therapists and discussed with nursing staff also.  Yevonne Pax, MD [facility] Pulmonary Critical Care Medicine Sleep Medicine

## 2019-09-07 DIAGNOSIS — J9621 Acute and chronic respiratory failure with hypoxia: Secondary | ICD-10-CM | POA: Diagnosis not present

## 2019-09-07 DIAGNOSIS — I482 Chronic atrial fibrillation, unspecified: Secondary | ICD-10-CM | POA: Diagnosis not present

## 2019-09-07 DIAGNOSIS — G309 Alzheimer's disease, unspecified: Secondary | ICD-10-CM | POA: Diagnosis not present

## 2019-09-07 DIAGNOSIS — I5032 Chronic diastolic (congestive) heart failure: Secondary | ICD-10-CM | POA: Diagnosis not present

## 2019-09-07 MED ORDER — GENERIC EXTERNAL MEDICATION
Status: DC
Start: ? — End: 2019-09-07

## 2019-09-07 NOTE — Progress Notes (Addendum)
Pulmonary Critical Care Medicine [facility] [place]   PULMONARY CRITICAL CARE SERVICE  PROGRESS NOTE  Date of Service: 09/07/2019  Tina Fuentes  KWI:[id number]  DOB: 01/14/1929   DOA: 08/15/2019  Referring Physician: Carron Curie, MD  HPI: Tina Fuentes is a 86 y.o. female seen for follow up of Acute on Chronic Respiratory Failure.  Patient remains on 20% aerosol trach collar using PMV satting well no distress.  Medications: Reviewed on Rounds  Physical Exam:  Vitals: Pulse 73 respirations 24 BP 113/90 O2 sat 98% temp 96.6  Ventilator Settings ATC 28%  . General: Comfortable at this time . Eyes: Grossly normal lids, irises & conjunctiva . ENT: grossly tongue is normal . Neck: no obvious mass . Cardiovascular: S1 S2 normal no gallop . Respiratory: No rales or rhonchi noted . Abdomen: soft . Skin: no rash seen on limited exam . Musculoskeletal: not rigid . Psychiatric:unable to assess . Neurologic: no seizure no involuntary movements         Lab Data:   Basic Metabolic Panel: Recent Labs  Lab 09/04/19 0425  NA 140  K 3.7  CL 99  CO2 30  GLUCOSE 130*  BUN 24*  CREATININE 0.48  CALCIUM 8.4*    ABG: No results for input(s): PHART, PCO2ART, PO2ART, HCO3, O2SAT in the last 168 hours.  Liver Function Tests: No results for input(s): AST, ALT, ALKPHOS, BILITOT, PROT, ALBUMIN in the last 168 hours. No results for input(s): LIPASE, AMYLASE in the last 168 hours. No results for input(s): AMMONIA in the last 168 hours.  CBC: Recent Labs  Lab 09/04/19 0425  WBC 5.8  HGB 10.6*  HCT 34.5*  MCV 100.0  PLT 195    Cardiac Enzymes: No results for input(s): CKTOTAL, CKMB, CKMBINDEX, TROPONINI in the last 168 hours.  BNP (last 3 results) No results for input(s): BNP in the last 8760 hours.  ProBNP (last 3 results) No results for input(s): PROBNP in the last 8760 hours.  Radiological Exams: No results found.  Assessment/Plan Active Problems:    Acute on chronic respiratory failure with hypoxia (HCC)   Severe sepsis (HCC)   Chronic atrial fibrillation (HCC)   Chronic diastolic heart failure of unknown etiology (HCC)   Alzheimer's dementia without behavioral disturbance (HCC)   1. Acute on chronic respiratory failure with hypoxia continue aerosol trach collar.  Continue to attempt weaning as tolerated.  Continue aggressive pulmonary toilet supportive measures. 2. Severe sepsis hemodynamics are stable 3. Chronic atrial fibrillation rate controlled at this time 4. Chronic diastolic heart failure right now is compensated 5. Alzheimer's dementia no change we will continue to follow   I have personally seen and evaluated the patient, evaluated laboratory and imaging results, formulated the assessment and plan and placed orders. The Patient requires high complexity decision making with multiple systems involvement.  Rounds were done with the Respiratory Therapy Director and Staff therapists and discussed with nursing staff also.  Yevonne Pax, MD [facility] Pulmonary Critical Care Medicine Sleep Medicine

## 2019-09-09 DIAGNOSIS — I482 Chronic atrial fibrillation, unspecified: Secondary | ICD-10-CM | POA: Diagnosis not present

## 2019-09-09 DIAGNOSIS — G309 Alzheimer's disease, unspecified: Secondary | ICD-10-CM | POA: Diagnosis not present

## 2019-09-09 DIAGNOSIS — I5032 Chronic diastolic (congestive) heart failure: Secondary | ICD-10-CM | POA: Diagnosis not present

## 2019-09-09 DIAGNOSIS — J9621 Acute and chronic respiratory failure with hypoxia: Secondary | ICD-10-CM | POA: Diagnosis not present

## 2019-09-09 LAB — CBC
HCT: 35.9 % — ABNORMAL LOW (ref 36.0–46.0)
Hemoglobin: 11.1 g/dL — ABNORMAL LOW (ref 12.0–15.0)
MCH: 30.7 pg (ref 26.0–34.0)
MCHC: 30.9 g/dL (ref 30.0–36.0)
MCV: 99.2 fL (ref 80.0–100.0)
Platelets: 218 10*3/uL (ref 150–400)
RBC: 3.62 MIL/uL — ABNORMAL LOW (ref 3.87–5.11)
RDW: 17.2 % — ABNORMAL HIGH (ref 11.5–15.5)
WBC: 6.7 10*3/uL (ref 4.0–10.5)
nRBC: 0 % (ref 0.0–0.2)

## 2019-09-09 LAB — BASIC METABOLIC PANEL
Anion gap: 10 (ref 5–15)
BUN: 24 mg/dL — ABNORMAL HIGH (ref 8–23)
CO2: 28 mmol/L (ref 22–32)
Calcium: 8.2 mg/dL — ABNORMAL LOW (ref 8.9–10.3)
Chloride: 102 mmol/L (ref 98–111)
Creatinine, Ser: 0.47 mg/dL (ref 0.44–1.00)
GFR calc Af Amer: >60 mL/min (ref >60)
GFR calc non Af Amer: >60 mL/min (ref >60)
Glucose, Bld: 127 mg/dL — ABNORMAL HIGH (ref 70–99)
Potassium: 3.6 mmol/L (ref 3.5–5.1)
Sodium: 140 mmol/L (ref 135–145)

## 2019-09-09 NOTE — Progress Notes (Addendum)
Pulmonary Critical Care Medicine [facility] [place]   PULMONARY CRITICAL CARE SERVICE  PROGRESS NOTE  Date of Service: 09/09/2019  Tina Fuentes  [id number]:[id number]  DOB: 02-05-1929   DOA: 08/15/2019  Referring Physician: Carron Curie, MD  HPI: Tina Fuentes is a 86 y.o. female seen for follow up of Acute on Chronic Respiratory Failure.  Patient continues on 28% aerosol trach collar satting well no fever distress.  Medications: Reviewed on Rounds  Physical Exam:  Vitals: Pulse 70 respirations 20 BP 128/72 O2 sat 98% temp 97.3  Ventilator Settings 28% ATC  . General: Comfortable at this time . Eyes: Grossly normal lids, irises & conjunctiva . ENT: grossly tongue is normal . Neck: no obvious mass . Cardiovascular: S1 S2 normal no gallop . Respiratory: No rales or rhonchi noted . Abdomen: soft . Skin: no rash seen on limited exam . Musculoskeletal: not rigid . Psychiatric:unable to assess . Neurologic: no seizure no involuntary movements         Lab Data:   Basic Metabolic Panel: Recent Labs  Lab 09/04/19 0425 09/09/19 0512  NA 140 140  K 3.7 3.6  CL 99 102  CO2 30 28  GLUCOSE 130* 127*  BUN 24* 24*  CREATININE 0.48 0.47  CALCIUM 8.4* 8.2*    ABG: No results for input(s): PHART, PCO2ART, PO2ART, HCO3, O2SAT in the last 168 hours.  Liver Function Tests: No results for input(s): AST, ALT, ALKPHOS, BILITOT, PROT, ALBUMIN in the last 168 hours. No results for input(s): LIPASE, AMYLASE in the last 168 hours. No results for input(s): AMMONIA in the last 168 hours.  CBC: Recent Labs  Lab 09/04/19 0425 09/09/19 0512  WBC 5.8 6.7  HGB 10.6* 11.1*  HCT 34.5* 35.9*  MCV 100.0 99.2  PLT 195 218    Cardiac Enzymes: No results for input(s): CKTOTAL, CKMB, CKMBINDEX, TROPONINI in the last 168 hours.  BNP (last 3 results) No results for input(s): BNP in the last 8760 hours.  ProBNP (last 3 results) No results for input(s): PROBNP in the last 8760  hours.  Radiological Exams: No results found.  Assessment/Plan Active Problems:   Acute on chronic respiratory failure with hypoxia (HCC)   Severe sepsis (HCC)   Chronic atrial fibrillation (HCC)   Chronic diastolic heart failure of unknown etiology (HCC)   Alzheimer's dementia without behavioral disturbance (HCC)   1. Acute on chronic respiratory failure with hypoxia continue aerosol trach collar.  Continue to attempt weaning as tolerated.  Continue aggressive pulmonary toilet supportive measures. 2. Severe sepsis hemodynamics are stable 3. Chronic atrial fibrillation rate controlled at this time 4. Chronic diastolic heart failure right now is compensated 5. Alzheimer's dementia no change we will continue to follow   I have personally seen and evaluated the patient, evaluated laboratory and imaging results, formulated the assessment and plan and placed orders. The Patient requires high complexity decision making with multiple systems involvement.  Rounds were done with the Respiratory Therapy Director and Staff therapists and discussed with nursing staff also.  Yevonne Pax, MD [facility] Pulmonary Critical Care Medicine Sleep Medicine

## 2019-09-10 DIAGNOSIS — G309 Alzheimer's disease, unspecified: Secondary | ICD-10-CM | POA: Diagnosis not present

## 2019-09-10 DIAGNOSIS — I5032 Chronic diastolic (congestive) heart failure: Secondary | ICD-10-CM | POA: Diagnosis not present

## 2019-09-10 DIAGNOSIS — I482 Chronic atrial fibrillation, unspecified: Secondary | ICD-10-CM | POA: Diagnosis not present

## 2019-09-10 DIAGNOSIS — J9621 Acute and chronic respiratory failure with hypoxia: Secondary | ICD-10-CM | POA: Diagnosis not present

## 2019-09-10 NOTE — Progress Notes (Addendum)
Pulmonary Critical Care Medicine [facility] [place]   PULMONARY CRITICAL CARE SERVICE  PROGRESS NOTE  Date of Service: 09/10/2019  Tina Fuentes  MPN:[id number]  DOB: 1928-08-06   DOA: 08/15/2019  Referring Physician: Carron Curie, MD  HPI: Tina Fuentes is a 86 y.o. female seen for follow up of Acute on Chronic Respiratory Failure.  Patient mains on 20% aerosol trach collar using PMV with no difficulty.  Medications: Reviewed on Rounds  Physical Exam:  Vitals: Pulse 66 respirations 30 BP 117/59 O2 sat 94% temp 96.0  Ventilator Settings 28% ATC  . General: Comfortable at this time . Eyes: Grossly normal lids, irises & conjunctiva . ENT: grossly tongue is normal . Neck: no obvious mass . Cardiovascular: S1 S2 normal no gallop . Respiratory: No rales or rhonchi noted . Abdomen: soft . Skin: no rash seen on limited exam . Musculoskeletal: not rigid . Psychiatric:unable to assess . Neurologic: no seizure no involuntary movements         Lab Data:   Basic Metabolic Panel: Recent Labs  Lab 09/04/19 0425 09/09/19 0512  NA 140 140  K 3.7 3.6  CL 99 102  CO2 30 28  GLUCOSE 130* 127*  BUN 24* 24*  CREATININE 0.48 0.47  CALCIUM 8.4* 8.2*    ABG: No results for input(s): PHART, PCO2ART, PO2ART, HCO3, O2SAT in the last 168 hours.  Liver Function Tests: No results for input(s): AST, ALT, ALKPHOS, BILITOT, PROT, ALBUMIN in the last 168 hours. No results for input(s): LIPASE, AMYLASE in the last 168 hours. No results for input(s): AMMONIA in the last 168 hours.  CBC: Recent Labs  Lab 09/04/19 0425 09/09/19 0512  WBC 5.8 6.7  HGB 10.6* 11.1*  HCT 34.5* 35.9*  MCV 100.0 99.2  PLT 195 218    Cardiac Enzymes: No results for input(s): CKTOTAL, CKMB, CKMBINDEX, TROPONINI in the last 168 hours.  BNP (last 3 results) No results for input(s): BNP in the last 8760 hours.  ProBNP (last 3 results) No results for input(s): PROBNP in the last 8760  hours.  Radiological Exams: No results found.  Assessment/Plan Active Problems:   Acute on chronic respiratory failure with hypoxia (HCC)   Severe sepsis (HCC)   Chronic atrial fibrillation (HCC)   Chronic diastolic heart failure of unknown etiology (HCC)   Alzheimer's dementia without behavioral disturbance (HCC)   1. Acute on chronic respiratory failure with hypoxiacontinue aerosol trach collar. Continue to attempt weaning as tolerated. Continue aggressive pulmonary toilet supportive measures. 2. Severe sepsis hemodynamics are stable 3. Chronic atrial fibrillation rate controlled at this time 4. Chronic diastolic heart failure right now is compensated 5. Alzheimer's dementia no change we will continue to follow   I have personally seen and evaluated the patient, evaluated laboratory and imaging results, formulated the assessment and plan and placed orders. The Patient requires high complexity decision making with multiple systems involvement.  Rounds were done with the Respiratory Therapy Director and Staff therapists and discussed with nursing staff also.  Yevonne Pax, MD [facility] Pulmonary Critical Care Medicine Sleep Medicine

## 2019-09-11 DIAGNOSIS — I482 Chronic atrial fibrillation, unspecified: Secondary | ICD-10-CM | POA: Diagnosis not present

## 2019-09-11 DIAGNOSIS — G309 Alzheimer's disease, unspecified: Secondary | ICD-10-CM | POA: Diagnosis not present

## 2019-09-11 DIAGNOSIS — J9621 Acute and chronic respiratory failure with hypoxia: Secondary | ICD-10-CM | POA: Diagnosis not present

## 2019-09-11 DIAGNOSIS — I5032 Chronic diastolic (congestive) heart failure: Secondary | ICD-10-CM | POA: Diagnosis not present

## 2019-09-11 NOTE — Progress Notes (Addendum)
Pulmonary Critical Care Medicine [facility] [place]   PULMONARY CRITICAL CARE SERVICE  PROGRESS NOTE  Date of Service: 09/11/2019  [name]  BSW:[id number]  DOB: [birth year]   DOA: 08/15/2019  Referring Physician: Carron Curie, MD  HPI: Tina Fuentes is a 86 y.o. female seen for follow up of Acute on Chronic Respiratory Failure.  Patient mains on 28% aerosol trach collar using PMV with no difficulty.  Medications: Reviewed on Rounds  Physical Exam:  Vitals: Pulse 77 respirations 30 BP 144/83 O2 sat 99% temp 96.7  Ventilator Settings ATC 28%  . General: Comfortable at this time . Eyes: Grossly normal lids, irises & conjunctiva . ENT: grossly tongue is normal . Neck: no obvious mass . Cardiovascular: S1 S2 normal no gallop . Respiratory: No rales or rhonchi noted . Abdomen: soft . Skin: no rash seen on limited exam . Musculoskeletal: not rigid . Psychiatric:unable to assess . Neurologic: no seizure no involuntary movements         Lab Data:   Basic Metabolic Panel: Recent Labs  Lab 09/09/19 0512  NA 140  K 3.6  CL 102  CO2 28  GLUCOSE 127*  BUN 24*  CREATININE 0.47  CALCIUM 8.2*    ABG: No results for input(s): PHART, PCO2ART, PO2ART, HCO3, O2SAT in the last 168 hours.  Liver Function Tests: No results for input(s): AST, ALT, ALKPHOS, BILITOT, PROT, ALBUMIN in the last 168 hours. No results for input(s): LIPASE, AMYLASE in the last 168 hours. No results for input(s): AMMONIA in the last 168 hours.  CBC: Recent Labs  Lab 09/09/19 0512  WBC 6.7  HGB 11.1*  HCT 35.9*  MCV 99.2  PLT 218    Cardiac Enzymes: No results for input(s): CKTOTAL, CKMB, CKMBINDEX, TROPONINI in the last 168 hours.  BNP (last 3 results) No results for input(s): BNP in the last 8760 hours.  ProBNP (last 3 results) No results for input(s): PROBNP in the last 8760 hours.  Radiological Exams: No results found.  Assessment/Plan Active Problems:   Acute on  chronic respiratory failure with hypoxia (HCC)   Severe sepsis (HCC)   Chronic atrial fibrillation (HCC)   Chronic diastolic heart failure of unknown etiology (HCC)   Alzheimer's dementia without behavioral disturbance (HCC)   1. Acute on chronic respiratory failure with hypoxiacontinue aerosol trach collar. Continue to attempt weaning as tolerated. Continue aggressive pulmonary toilet supportive measures. 2. Severe sepsis hemodynamics are stable 3. Chronic atrial fibrillation rate controlled at this time 4. Chronic diastolic heart failure right now is compensated 5. Alzheimer's dementia no change we will continue to follow   I have personally seen and evaluated the patient, evaluated laboratory and imaging results, formulated the assessment and plan and placed orders. The Patient requires high complexity decision making with multiple systems involvement.  Rounds were done with the Respiratory Therapy Director and Staff therapists and discussed with nursing staff also.  Yevonne Pax, MD [facility] Pulmonary Critical Care Medicine Sleep Medicine

## 2019-09-12 DIAGNOSIS — J9621 Acute and chronic respiratory failure with hypoxia: Secondary | ICD-10-CM | POA: Diagnosis not present

## 2019-09-12 DIAGNOSIS — I482 Chronic atrial fibrillation, unspecified: Secondary | ICD-10-CM | POA: Diagnosis not present

## 2019-09-12 DIAGNOSIS — G309 Alzheimer's disease, unspecified: Secondary | ICD-10-CM | POA: Diagnosis not present

## 2019-09-12 DIAGNOSIS — I5032 Chronic diastolic (congestive) heart failure: Secondary | ICD-10-CM | POA: Diagnosis not present

## 2019-09-12 NOTE — Progress Notes (Addendum)
Pulmonary Critical Care Medicine [facility] [place]   PULMONARY CRITICAL CARE SERVICE  PROGRESS NOTE  Date of Service: 09/12/2019  Tina Fuentes  OZH:[id number]  DOB: 1929-06-03   DOA: 08/15/2019  Referring Physician: Carron Curie, MD  HPI: Tina Fuentes is a 86 y.o. female seen for follow up of Acute on Chronic Respiratory Failure.  Patient mains on 20% aerosol trach collar using PMV with no difficulty.  Medications: Reviewed on Rounds  Physical Exam:  Vitals: Pulse 80 versus 20 BP 122/74 O2 sat 97% temp 6.5  Ventilator Settings 28% ATC  . General: Comfortable at this time . Eyes: Grossly normal lids, irises & conjunctiva . ENT: grossly tongue is normal . Neck: no obvious mass . Cardiovascular: S1 S2 normal no gallop . Respiratory: No rales or rhonchi noted . Abdomen: soft . Skin: no rash seen on limited exam . Musculoskeletal: not rigid . Psychiatric:unable to assess . Neurologic: no seizure no involuntary movements         Lab Data:   Basic Metabolic Panel: Recent Labs  Lab 09/09/19 0512  NA 140  K 3.6  CL 102  CO2 28  GLUCOSE 127*  BUN 24*  CREATININE 0.47  CALCIUM 8.2*    ABG: No results for input(s): PHART, PCO2ART, PO2ART, HCO3, O2SAT in the last 168 hours.  Liver Function Tests: No results for input(s): AST, ALT, ALKPHOS, BILITOT, PROT, ALBUMIN in the last 168 hours. No results for input(s): LIPASE, AMYLASE in the last 168 hours. No results for input(s): AMMONIA in the last 168 hours.  CBC: Recent Labs  Lab 09/09/19 0512  WBC 6.7  HGB 11.1*  HCT 35.9*  MCV 99.2  PLT 218    Cardiac Enzymes: No results for input(s): CKTOTAL, CKMB, CKMBINDEX, TROPONINI in the last 168 hours.  BNP (last 3 results) No results for input(s): BNP in the last 8760 hours.  ProBNP (last 3 results) No results for input(s): PROBNP in the last 8760 hours.  Radiological Exams: No results found.  Assessment/Plan Active Problems:   Acute on chronic  respiratory failure with hypoxia (HCC)   Severe sepsis (HCC)   Chronic atrial fibrillation (HCC)   Chronic diastolic heart failure of unknown etiology (HCC)   Alzheimer's dementia without behavioral disturbance (HCC)   1. Acute on chronic respiratory failure with hypoxiacontinue trach collar trials at this time currently requiring 20% FiO2 using PMV with no difficulty we will continue aggressive pulmonary toilet supportive measures. 2. Severe sepsis hemodynamics are stable 3. Chronic atrial fibrillation rate controlled at this time 4. Chronic diastolic heart failure right now is compensated 5. Alzheimer's dementia no change we will continue to follow   I have personally seen and evaluated the patient, evaluated laboratory and imaging results, formulated the assessment and plan and placed orders. The Patient requires high complexity decision making with multiple systems involvement.  Rounds were done with the Respiratory Therapy Director and Staff therapists and discussed with nursing staff also.  Yevonne Pax, MD [facility] Pulmonary Critical Care Medicine Sleep Medicine

## 2019-09-13 DIAGNOSIS — I5032 Chronic diastolic (congestive) heart failure: Secondary | ICD-10-CM | POA: Diagnosis not present

## 2019-09-13 DIAGNOSIS — I482 Chronic atrial fibrillation, unspecified: Secondary | ICD-10-CM | POA: Diagnosis not present

## 2019-09-13 DIAGNOSIS — J9621 Acute and chronic respiratory failure with hypoxia: Secondary | ICD-10-CM | POA: Diagnosis not present

## 2019-09-13 DIAGNOSIS — G309 Alzheimer's disease, unspecified: Secondary | ICD-10-CM | POA: Diagnosis not present

## 2019-09-13 MED ORDER — GENERIC EXTERNAL MEDICATION
Status: DC
Start: ? — End: 2019-09-13

## 2019-09-13 NOTE — Progress Notes (Addendum)
Pulmonary Critical Care Medicine [facility] [place]   PULMONARY CRITICAL CARE SERVICE  PROGRESS NOTE  Date of Service: 09/13/2019  Tina Fuentes  XAJ:[id number]  DOB: 1929/06/17   DOA: 08/15/2019  Referring Physician: Carron Curie, MD  HPI: Tina Fuentes is a 86 y.o. female seen for follow up of Acute on Chronic Respiratory Failure.  Patient remains on aerosol trach collar 28% FiO2 using PMV with no difficulty.  No fever or distress noted.  Medications: Reviewed on Rounds  Physical Exam:  Vitals: Pulse 78 respirations 20 BP 134/95 O2 sat 99% temp 96.9  Ventilator Settings 28% aerosol trach collar  . General: Comfortable at this time . Eyes: Grossly normal lids, irises & conjunctiva . ENT: grossly tongue is normal . Neck: no obvious mass . Cardiovascular: S1 S2 normal no gallop . Respiratory: No rales or rhonchi noted . Abdomen: soft . Skin: no rash seen on limited exam . Musculoskeletal: not rigid . Psychiatric:unable to assess . Neurologic: no seizure no involuntary movements         Lab Data:   Basic Metabolic Panel: Recent Labs  Lab 09/09/19 0512  NA 140  K 3.6  CL 102  CO2 28  GLUCOSE 127*  BUN 24*  CREATININE 0.47  CALCIUM 8.2*    ABG: No results for input(s): PHART, PCO2ART, PO2ART, HCO3, O2SAT in the last 168 hours.  Liver Function Tests: No results for input(s): AST, ALT, ALKPHOS, BILITOT, PROT, ALBUMIN in the last 168 hours. No results for input(s): LIPASE, AMYLASE in the last 168 hours. No results for input(s): AMMONIA in the last 168 hours.  CBC: Recent Labs  Lab 09/09/19 0512  WBC 6.7  HGB 11.1*  HCT 35.9*  MCV 99.2  PLT 218    Cardiac Enzymes: No results for input(s): CKTOTAL, CKMB, CKMBINDEX, TROPONINI in the last 168 hours.  BNP (last 3 results) No results for input(s): BNP in the last 8760 hours.  ProBNP (last 3 results) No results for input(s): PROBNP in the last 8760 hours.  Radiological Exams: No results  found.  Assessment/Plan Active Problems:   Acute on chronic respiratory failure with hypoxia (HCC)   Severe sepsis (HCC)   Chronic atrial fibrillation (HCC)   Chronic diastolic heart failure of unknown etiology (HCC)   Alzheimer's dementia without behavioral disturbance (HCC)   1. Acute on chronic respiratory failure with hypoxiacontinue to wean aerosol trach collar if patient can tolerate.  Continue PMV use.  Continue aggressive pulmonary toilet supportive measures. 2. Severe sepsis hemodynamics are stable 3. Chronic atrial fibrillation rate controlled at this time 4. Chronic diastolic heart failure right now is compensated 5. Alzheimer's dementia no change we will continue to follow   I have personally seen and evaluated the patient, evaluated laboratory and imaging results, formulated the assessment and plan and placed orders. The Patient requires high complexity decision making with multiple systems involvement.  Rounds were done with the Respiratory Therapy Director and Staff therapists and discussed with nursing staff also.  Yevonne Pax, MD [facility] Pulmonary Critical Care Medicine Sleep Medicine

## 2019-09-14 DIAGNOSIS — J9621 Acute and chronic respiratory failure with hypoxia: Secondary | ICD-10-CM | POA: Diagnosis not present

## 2019-09-14 DIAGNOSIS — I482 Chronic atrial fibrillation, unspecified: Secondary | ICD-10-CM | POA: Diagnosis not present

## 2019-09-14 DIAGNOSIS — G309 Alzheimer's disease, unspecified: Secondary | ICD-10-CM | POA: Diagnosis not present

## 2019-09-14 DIAGNOSIS — I5032 Chronic diastolic (congestive) heart failure: Secondary | ICD-10-CM | POA: Diagnosis not present

## 2019-09-16 DIAGNOSIS — G309 Alzheimer's disease, unspecified: Secondary | ICD-10-CM | POA: Diagnosis not present

## 2019-09-16 DIAGNOSIS — I482 Chronic atrial fibrillation, unspecified: Secondary | ICD-10-CM | POA: Diagnosis not present

## 2019-09-16 DIAGNOSIS — J9621 Acute and chronic respiratory failure with hypoxia: Secondary | ICD-10-CM | POA: Diagnosis not present

## 2019-09-16 DIAGNOSIS — I5032 Chronic diastolic (congestive) heart failure: Secondary | ICD-10-CM | POA: Diagnosis not present

## 2019-09-16 NOTE — Progress Notes (Addendum)
Pulmonary Critical Care Medicine [facility] [place]   PULMONARY CRITICAL CARE SERVICE  PROGRESS NOTE  Date of Service: 09/16/2019  Tina Fuentes  [id number]:[id number]  DOB: Aug 09, 1928   DOA: 08/15/2019  Referring Physician: Carron Curie, MD  HPI: Tina Fuentes is a 86 y.o. female seen for follow up of Acute on Chronic Respiratory Failure.  Patient remains on 28% aerosol trach collar using PMV with no difficulty at this time.  Medications: Reviewed on Rounds  Physical Exam:  Vitals: Pulse 93 respirations 20 BP 143/89 O2 sat 95% temp 97.8  Ventilator Settings 28% aerosol trach collar  . General: Comfortable at this time . Eyes: Grossly normal lids, irises & conjunctiva . ENT: grossly tongue is normal . Neck: no obvious mass . Cardiovascular: S1 S2 normal no gallop . Respiratory: No rales or rhonchi noted . Abdomen: soft . Skin: no rash seen on limited exam . Musculoskeletal: not rigid . Psychiatric:unable to assess . Neurologic: no seizure no involuntary movements         Lab Data:   Basic Metabolic Panel: No results for input(s): NA, K, CL, CO2, GLUCOSE, BUN, CREATININE, CALCIUM, MG, PHOS in the last 168 hours.  ABG: No results for input(s): PHART, PCO2ART, PO2ART, HCO3, O2SAT in the last 168 hours.  Liver Function Tests: No results for input(s): AST, ALT, ALKPHOS, BILITOT, PROT, ALBUMIN in the last 168 hours. No results for input(s): LIPASE, AMYLASE in the last 168 hours. No results for input(s): AMMONIA in the last 168 hours.  CBC: No results for input(s): WBC, NEUTROABS, HGB, HCT, MCV, PLT in the last 168 hours.  Cardiac Enzymes: No results for input(s): CKTOTAL, CKMB, CKMBINDEX, TROPONINI in the last 168 hours.  BNP (last 3 results) No results for input(s): BNP in the last 8760 hours.  ProBNP (last 3 results) No results for input(s): PROBNP in the last 8760 hours.  Radiological Exams: No results found.  Assessment/Plan Active Problems:   Acute  on chronic respiratory failure with hypoxia (HCC)   Severe sepsis (HCC)   Chronic atrial fibrillation (HCC)   Chronic diastolic heart failure of unknown etiology (HCC)   Alzheimer's dementia without behavioral disturbance (HCC)   1. Acute on chronic respiratory failure with hypoxiacontinue aggressive pulmonary toilet supportive measures continue to wean trach collar as tolerated. 2. Severe sepsis hemodynamics are stable 3. Chronic atrial fibrillation rate controlled at this time 4. Chronic diastolic heart failure right now is compensated 5. Alzheimer's dementia no change we will continue to follow   I have personally seen and evaluated the patient, evaluated laboratory and imaging results, formulated the assessment and plan and placed orders. The Patient requires high complexity decision making with multiple systems involvement.  Rounds were done with the Respiratory Therapy Director and Staff therapists and discussed with nursing staff also.  Yevonne Pax, MD [facility] Pulmonary Critical Care Medicine Sleep Medicine

## 2019-09-16 NOTE — Progress Notes (Addendum)
Pulmonary Critical Care Medicine [facility] [place]   PULMONARY CRITICAL CARE SERVICE  PROGRESS NOTE  Date of Service: 09/16/2019  MUNIRAH DOERNER  [id number]  DOB: 1928-10-22   DOA: 08/15/2019  Referring Physician: Carron Curie, MD  HPI: AERILYNN GOIN is a 86 y.o. female seen for follow up of Acute on Chronic Respiratory Failure.  Patient made aerosol trach collar at this time 28% FiO2 using PMV with no difficulty.  Medications: Reviewed on Rounds  Physical Exam:  Vitals: Pulse 83 respirations 20 BP 136/77 O2 sat 96% temp 96.5  Ventilator Settings 28% ATC  . General: Comfortable at this time . Eyes: Grossly normal lids, irises & conjunctiva . ENT: grossly tongue is normal . Neck: no obvious mass . Cardiovascular: S1 S2 normal no gallop . Respiratory: No rales or rhonchi noted . Abdomen: soft . Skin: no rash seen on limited exam . Musculoskeletal: not rigid . Psychiatric:unable to assess . Neurologic: no seizure no involuntary movements         Lab Data:   Basic Metabolic Panel: No results for input(s): NA, K, CL, CO2, GLUCOSE, BUN, CREATININE, CALCIUM, MG, PHOS in the last 168 hours.  ABG: No results for input(s): PHART, PCO2ART, PO2ART, HCO3, O2SAT in the last 168 hours.  Liver Function Tests: No results for input(s): AST, ALT, ALKPHOS, BILITOT, PROT, ALBUMIN in the last 168 hours. No results for input(s): LIPASE, AMYLASE in the last 168 hours. No results for input(s): AMMONIA in the last 168 hours.  CBC: No results for input(s): WBC, NEUTROABS, HGB, HCT, MCV, PLT in the last 168 hours.  Cardiac Enzymes: No results for input(s): CKTOTAL, CKMB, CKMBINDEX, TROPONINI in the last 168 hours.  BNP (last 3 results) No results for input(s): BNP in the last 8760 hours.  ProBNP (last 3 results) No results for input(s): PROBNP in the last 8760 hours.  Radiological Exams: No results found.  Assessment/Plan Active Problems:   Acute on chronic  respiratory failure with hypoxia (HCC)   Severe sepsis (HCC)   Chronic atrial fibrillation (HCC)   Chronic diastolic heart failure of unknown etiology (HCC)   Alzheimer's dementia without behavioral disturbance (HCC)   1. Acute on chronic respiratory failure with hypoxiacontinue aerosol trach collar. Continue to attempt weaning as tolerated. Continue aggressive pulmonary toilet supportive measures. 2. Severe sepsis hemodynamics are stable 3. Chronic atrial fibrillation rate controlled at this time 4. Chronic diastolic heart failure right now is compensated 5. Alzheimer's dementia no change we will continue to follow   I have personally seen and evaluated the patient, evaluated laboratory and imaging results, formulated the assessment and plan and placed orders. The Patient requires high complexity decision making with multiple systems involvement.  Rounds were done with the Respiratory Therapy Director and Staff therapists and discussed with nursing staff also.  Yevonne Pax, MD [facility] Pulmonary Critical Care Medicine Sleep Medicine

## 2019-09-17 DIAGNOSIS — I482 Chronic atrial fibrillation, unspecified: Secondary | ICD-10-CM | POA: Diagnosis not present

## 2019-09-17 DIAGNOSIS — J9621 Acute and chronic respiratory failure with hypoxia: Secondary | ICD-10-CM | POA: Diagnosis not present

## 2019-09-17 DIAGNOSIS — I5032 Chronic diastolic (congestive) heart failure: Secondary | ICD-10-CM | POA: Diagnosis not present

## 2019-09-17 DIAGNOSIS — G309 Alzheimer's disease, unspecified: Secondary | ICD-10-CM | POA: Diagnosis not present

## 2019-09-17 LAB — BASIC METABOLIC PANEL
Anion gap: 13 (ref 5–15)
BUN: 32 mg/dL — ABNORMAL HIGH (ref 8–23)
CO2: 28 mmol/L (ref 22–32)
Calcium: 8.8 mg/dL — ABNORMAL LOW (ref 8.9–10.3)
Chloride: 98 mmol/L (ref 98–111)
Creatinine, Ser: 0.58 mg/dL (ref 0.44–1.00)
GFR calc Af Amer: >60 mL/min (ref >60)
GFR calc non Af Amer: >60 mL/min (ref >60)
Glucose, Bld: 153 mg/dL — ABNORMAL HIGH (ref 70–99)
Potassium: 4.3 mmol/L (ref 3.5–5.1)
Sodium: 139 mmol/L (ref 135–145)

## 2019-09-17 LAB — CBC
HCT: 38.1 % (ref 36.0–46.0)
Hemoglobin: 11.6 g/dL — ABNORMAL LOW (ref 12.0–15.0)
MCH: 30.9 pg (ref 26.0–34.0)
MCHC: 30.4 g/dL (ref 30.0–36.0)
MCV: 101.3 fL — ABNORMAL HIGH (ref 80.0–100.0)
Platelets: 228 10*3/uL (ref 150–400)
RBC: 3.76 MIL/uL — ABNORMAL LOW (ref 3.87–5.11)
RDW: 17.4 % — ABNORMAL HIGH (ref 11.5–15.5)
WBC: 11.7 10*3/uL — ABNORMAL HIGH (ref 4.0–10.5)
nRBC: 0 % (ref 0.0–0.2)

## 2019-09-17 NOTE — Progress Notes (Signed)
Pulmonary Critical Care Medicine Tina Fuentes [place]   PULMONARY CRITICAL CARE SERVICE  PROGRESS NOTE  Date of Service: 09/17/2019  Tina Fuentes  GYK:[id number]  DOB: [birth year]   DOA: 08/15/2019  Referring Physician: Carron Curie, MD  HPI: Tina Fuentes is a 86 y.o. female seen for follow up of Acute on Chronic Respiratory Failure.  At this time patient is on T collar on 28% FiO2 at her baseline settings.  Awaiting transfer to skilled nursing facility  Medications: Reviewed on Rounds  Physical Exam:  Vitals: Temperature is 97.6 pulse 84 respiratory rate 20 blood pressure is 137/87 saturations are 98%  Ventilator Settings on T collar with an FiO2 of 28%  . General: Comfortable at this time . Eyes: Grossly normal lids, irises & conjunctiva . ENT: grossly tongue is normal . Neck: no obvious mass . Cardiovascular: S1 S2 normal no gallop . Respiratory: Coarse breath sounds with a few rhonchi . Abdomen: soft . Skin: no rash seen on limited exam . Musculoskeletal: not rigid . Psychiatric:unable to assess . Neurologic: no seizure no involuntary movements         Lab Data:   Basic Metabolic Panel: Recent Labs  Lab 09/17/19 0627  NA 139  K 4.3  CL 98  CO2 28  GLUCOSE 153*  BUN 32*  CREATININE 0.58  CALCIUM 8.8*    ABG: No results for input(s): PHART, PCO2ART, PO2ART, HCO3, O2SAT in the last 168 hours.  Liver Function Tests: No results for input(s): AST, ALT, ALKPHOS, BILITOT, PROT, ALBUMIN in the last 168 hours. No results for input(s): LIPASE, AMYLASE in the last 168 hours. No results for input(s): AMMONIA in the last 168 hours.  CBC: Recent Labs  Lab 09/17/19 0627  WBC 11.7*  HGB 11.6*  HCT 38.1  MCV 101.3*  PLT 228    Cardiac Enzymes: No results for input(s): CKTOTAL, CKMB, CKMBINDEX, TROPONINI in the last 168 hours.  BNP (last 3 results) No results for input(s): BNP in the last 8760 hours.  ProBNP (last 3 results) No results for  input(s): PROBNP in the last 8760 hours.  Radiological Exams: No results found.  Assessment/Plan Active Problems:   Acute on chronic respiratory failure with hypoxia (HCC)   Severe sepsis (HCC)   Chronic atrial fibrillation (HCC)   Chronic diastolic heart failure of unknown etiology (HCC)   Alzheimer's dementia without behavioral disturbance (HCC)   1. Acute on chronic respiratory failure with hypoxia patient will be continued on T collar trials compression management pulmonary toilet.  Awaiting discharge to skilled nursing 2. Severe sepsis resolved 3. Chronic atrial fibrillation is rate controlled 4. Chronic diastolic heart failure right now is compensated 5. Alzheimer's no change patient is at baseline   I have personally seen and evaluated the patient, evaluated laboratory and imaging results, formulated the assessment and plan and placed orders. The Patient requires high complexity decision making with multiple systems involvement.  Rounds were done with the Respiratory Therapy Director and Staff therapists and discussed with nursing staff also.  Yevonne Pax, MD [facility] Pulmonary Critical Care Medicine Sleep Medicine

## 2019-09-18 DIAGNOSIS — J9621 Acute and chronic respiratory failure with hypoxia: Secondary | ICD-10-CM | POA: Diagnosis not present

## 2019-09-18 DIAGNOSIS — I5032 Chronic diastolic (congestive) heart failure: Secondary | ICD-10-CM | POA: Diagnosis not present

## 2019-09-18 DIAGNOSIS — I482 Chronic atrial fibrillation, unspecified: Secondary | ICD-10-CM | POA: Diagnosis not present

## 2019-09-18 DIAGNOSIS — G309 Alzheimer's disease, unspecified: Secondary | ICD-10-CM | POA: Diagnosis not present

## 2019-09-18 NOTE — Progress Notes (Signed)
Pulmonary Critical Care Medicine [facility] [place]   PULMONARY CRITICAL CARE SERVICE  PROGRESS NOTE  Date of Service: 09/18/2019  Tina Fuentes  VCB:[id number]  DOB: [birth year]   DOA: 08/15/2019  Referring Physician: Carron Curie, MD  HPI: Tina Fuentes is a 86 y.o. female seen for follow up of Acute on Chronic Respiratory Failure.  Patient currently is on T collar on 28% FiO2 has copious secretions requiring frequent suctioning  Medications: Reviewed on Rounds  Physical Exam:  Vitals: Temperature is 97.9 pulse 81 respiratory rate 18 blood pressure is 148/86 saturations 99%  Ventilator Settings on T collar FiO2 28%  . General: Comfortable at this time . Eyes: Grossly normal lids, irises & conjunctiva . ENT: grossly tongue is normal . Neck: no obvious mass . Cardiovascular: S1 S2 normal no gallop . Respiratory: No rhonchi no rales are noted at this time . Abdomen: soft . Skin: no rash seen on limited exam . Musculoskeletal: not rigid . Psychiatric:unable to assess . Neurologic: no seizure no involuntary movements         Lab Data:   Basic Metabolic Panel: Recent Labs  Lab 09/17/19 0627  NA 139  K 4.3  CL 98  CO2 28  GLUCOSE 153*  BUN 32*  CREATININE 0.58  CALCIUM 8.8*    ABG: No results for input(s): PHART, PCO2ART, PO2ART, HCO3, O2SAT in the last 168 hours.  Liver Function Tests: No results for input(s): AST, ALT, ALKPHOS, BILITOT, PROT, ALBUMIN in the last 168 hours. No results for input(s): LIPASE, AMYLASE in the last 168 hours. No results for input(s): AMMONIA in the last 168 hours.  CBC: Recent Labs  Lab 09/17/19 0627  WBC 11.7*  HGB 11.6*  HCT 38.1  MCV 101.3*  PLT 228    Cardiac Enzymes: No results for input(s): CKTOTAL, CKMB, CKMBINDEX, TROPONINI in the last 168 hours.  BNP (last 3 results) No results for input(s): BNP in the last 8760 hours.  ProBNP (last 3 results) No results for input(s): PROBNP in the last 8760  hours.  Radiological Exams: No results found.  Assessment/Plan Active Problems:   Acute on chronic respiratory failure with hypoxia (HCC)   Severe sepsis (HCC)   Chronic atrial fibrillation (HCC)   Chronic diastolic heart failure of unknown etiology (HCC)   Alzheimer's dementia without behavioral disturbance (HCC)   1. Acute on chronic respiratory failure with hypoxia we will continue secretion management pulmonary toilet 2. Severe sepsis hemodynamics are stable 3. Chronic atrial fibrillation rate controlled 4. Chronic diastolic heart failure compensated 5. Alzheimer's dementia no change we will continue to follow along   I have personally seen and evaluated the patient, evaluated laboratory and imaging results, formulated the assessment and plan and placed orders. The Patient requires high complexity decision making with multiple systems involvement.  Rounds were done with the Respiratory Therapy Director and Staff therapists and discussed with nursing staff also.  Yevonne Pax, MD [facility] Pulmonary Critical Care Medicine Sleep Medicine

## 2019-09-19 DIAGNOSIS — I482 Chronic atrial fibrillation, unspecified: Secondary | ICD-10-CM | POA: Diagnosis not present

## 2019-09-19 DIAGNOSIS — J9621 Acute and chronic respiratory failure with hypoxia: Secondary | ICD-10-CM | POA: Diagnosis not present

## 2019-09-19 DIAGNOSIS — I5032 Chronic diastolic (congestive) heart failure: Secondary | ICD-10-CM | POA: Diagnosis not present

## 2019-09-19 DIAGNOSIS — G309 Alzheimer's disease, unspecified: Secondary | ICD-10-CM | POA: Diagnosis not present

## 2019-09-19 LAB — SARS CORONAVIRUS 2 (TAT 6-24 HRS): SARS Coronavirus 2: NEGATIVE

## 2019-09-19 NOTE — Progress Notes (Signed)
Pulmonary Critical Care Medicine [facility] [place]   PULMONARY CRITICAL CARE SERVICE  PROGRESS NOTE  Date of Service: 09/19/2019  Tina Fuentes  WCB:[id number]  DOB: 1928/10/26   DOA: 08/15/2019  Referring Physician: Carron Curie, MD  HPI: Tina Fuentes is a 86 y.o. female seen for follow up of Acute on Chronic Respiratory Failure.  Currently is on T collar on 28% this is her baseline she has copious secretions noted so will to proceed from here to capping for now.  Medications: Reviewed on Rounds  Physical Exam:  Vitals: Temperature 96.8 pulse 84 respiratory 18 blood pressure is 113/89 saturations 99%  Ventilator Settings on T collar with an FiO2 of 28%  . General: Comfortable at this time . Eyes: Grossly normal lids, irises & conjunctiva . ENT: grossly tongue is normal . Neck: no obvious mass . Cardiovascular: S1 S2 normal no gallop . Respiratory: Coarse rhonchi expansion is equal . Abdomen: soft . Skin: no rash seen on limited exam . Musculoskeletal: not rigid . Psychiatric:unable to assess . Neurologic: no seizure no involuntary movements         Lab Data:   Basic Metabolic Panel: Recent Labs  Lab 09/17/19 0627  NA 139  K 4.3  CL 98  CO2 28  GLUCOSE 153*  BUN 32*  CREATININE 0.58  CALCIUM 8.8*    ABG: No results for input(s): PHART, PCO2ART, PO2ART, HCO3, O2SAT in the last 168 hours.  Liver Function Tests: No results for input(s): AST, ALT, ALKPHOS, BILITOT, PROT, ALBUMIN in the last 168 hours. No results for input(s): LIPASE, AMYLASE in the last 168 hours. No results for input(s): AMMONIA in the last 168 hours.  CBC: Recent Labs  Lab 09/17/19 0627  WBC 11.7*  HGB 11.6*  HCT 38.1  MCV 101.3*  PLT 228    Cardiac Enzymes: No results for input(s): CKTOTAL, CKMB, CKMBINDEX, TROPONINI in the last 168 hours.  BNP (last 3 results) No results for input(s): BNP in the last 8760 hours.  ProBNP (last 3 results) No results for input(s):  PROBNP in the last 8760 hours.  Radiological Exams: No results found.  Assessment/Plan Active Problems:   Acute on chronic respiratory failure with hypoxia (HCC)   Severe sepsis (HCC)   Chronic atrial fibrillation (HCC)   Chronic diastolic heart failure of unknown etiology (HCC)   Alzheimer's dementia without behavioral disturbance (HCC)   1. Acute on chronic respiratory failure with hypoxia plan is going to be to continue with the T collar.  Patient not a candidate right now for capping secondary to copious secretions 2. Severe sepsis hemodynamics are stable at this time will atrial fibrillation rate is controlled we will continue to follow 3. Chronic atrial fibrillation rate is controlled 4. Chronic diastolic heart failure right now appears to be compensated we will continue to monitor 5. Alzheimer's dementia patient is at baseline we will continue with supportive care.   I have personally seen and evaluated the patient, evaluated laboratory and imaging results, formulated the assessment and plan and placed orders. The Patient requires high complexity decision making with multiple systems involvement.  Rounds were done with the Respiratory Therapy Director and Staff therapists and discussed with nursing staff also.  Yevonne Pax, MD [facility] Pulmonary Critical Care Medicine Sleep Medicine

## 2019-09-20 DIAGNOSIS — J9621 Acute and chronic respiratory failure with hypoxia: Secondary | ICD-10-CM | POA: Diagnosis not present

## 2019-09-20 DIAGNOSIS — I5032 Chronic diastolic (congestive) heart failure: Secondary | ICD-10-CM | POA: Diagnosis not present

## 2019-09-20 DIAGNOSIS — G309 Alzheimer's disease, unspecified: Secondary | ICD-10-CM | POA: Diagnosis not present

## 2019-09-20 DIAGNOSIS — I482 Chronic atrial fibrillation, unspecified: Secondary | ICD-10-CM | POA: Diagnosis not present

## 2019-09-20 NOTE — Progress Notes (Signed)
Pulmonary Critical Care Medicine [facility] [place]   PULMONARY CRITICAL CARE SERVICE  PROGRESS NOTE  Date of Service: 09/20/2019  Tina Fuentes  JOA:[id number]  DOB: [birth year]   DOA: 08/15/2019  Referring Physician: Carron Curie, MD  HPI: Tina Fuentes is a 86 y.o. female seen for follow up of Acute on Chronic Respiratory Failure.  She is at baseline right now is on T collar secretions have been somewhat copious  Medications: Reviewed on Rounds  Physical Exam:  Vitals: Temperature 98.4 pulse 84 respiratory 22 blood pressure is 137/81 saturations 96%  Ventilator Settings off the ventilator on T collar  . General: Comfortable at this time . Eyes: Grossly normal lids, irises & conjunctiva . ENT: grossly tongue is normal . Neck: no obvious mass . Cardiovascular: S1 S2 normal no gallop . Respiratory: No rhonchi no rales are noted at this time . Abdomen: soft . Skin: no rash seen on limited exam . Musculoskeletal: not rigid . Psychiatric:unable to assess . Neurologic: no seizure no involuntary movements         Lab Data:   Basic Metabolic Panel: Recent Labs  Lab 09/17/19 0627  NA 139  K 4.3  CL 98  CO2 28  GLUCOSE 153*  BUN 32*  CREATININE 0.58  CALCIUM 8.8*    ABG: No results for input(s): PHART, PCO2ART, PO2ART, HCO3, O2SAT in the last 168 hours.  Liver Function Tests: No results for input(s): AST, ALT, ALKPHOS, BILITOT, PROT, ALBUMIN in the last 168 hours. No results for input(s): LIPASE, AMYLASE in the last 168 hours. No results for input(s): AMMONIA in the last 168 hours.  CBC: Recent Labs  Lab 09/17/19 0627  WBC 11.7*  HGB 11.6*  HCT 38.1  MCV 101.3*  PLT 228    Cardiac Enzymes: No results for input(s): CKTOTAL, CKMB, CKMBINDEX, TROPONINI in the last 168 hours.  BNP (last 3 results) No results for input(s): BNP in the last 8760 hours.  ProBNP (last 3 results) No results for input(s): PROBNP in the last 8760 hours.  Radiological  Exams: No results found.  Assessment/Plan Active Problems:   Acute on chronic respiratory failure with hypoxia (HCC)   Severe sepsis (HCC)   Chronic atrial fibrillation (HCC)   Chronic diastolic heart failure of unknown etiology (HCC)   Alzheimer's dementia without behavioral disturbance (HCC)   1. Acute on chronic respiratory failure hypoxia continue with T collar trials titrate oxygen continue pulmonary toilet. 2. Severe sepsis this has resolved hemodynamics are stable 3. Chronic atrial fibrillation rate controlled 4. Chronic diastolic heart failure follow fluid status closely 5. Alzheimer's dementia no change   I have personally seen and evaluated the patient, evaluated laboratory and imaging results, formulated the assessment and plan and placed orders. The Patient requires high complexity decision making with multiple systems involvement.  Rounds were done with the Respiratory Therapy Director and Staff therapists and discussed with nursing staff also.  Yevonne Pax, MD [facility] Pulmonary Critical Care Medicine Sleep Medicine

## 2019-09-21 DIAGNOSIS — I482 Chronic atrial fibrillation, unspecified: Secondary | ICD-10-CM | POA: Diagnosis not present

## 2019-09-21 DIAGNOSIS — J9621 Acute and chronic respiratory failure with hypoxia: Secondary | ICD-10-CM | POA: Diagnosis not present

## 2019-09-21 DIAGNOSIS — I5032 Chronic diastolic (congestive) heart failure: Secondary | ICD-10-CM | POA: Diagnosis not present

## 2019-09-21 DIAGNOSIS — G309 Alzheimer's disease, unspecified: Secondary | ICD-10-CM | POA: Diagnosis not present

## 2019-09-21 LAB — CBC
HCT: 39.4 % (ref 36.0–46.0)
Hemoglobin: 12 g/dL (ref 12.0–15.0)
MCH: 30.7 pg (ref 26.0–34.0)
MCHC: 30.5 g/dL (ref 30.0–36.0)
MCV: 100.8 fL — ABNORMAL HIGH (ref 80.0–100.0)
Platelets: 265 10*3/uL (ref 150–400)
RBC: 3.91 MIL/uL (ref 3.87–5.11)
RDW: 17.1 % — ABNORMAL HIGH (ref 11.5–15.5)
WBC: 11.9 10*3/uL — ABNORMAL HIGH (ref 4.0–10.5)
nRBC: 0 % (ref 0.0–0.2)

## 2019-09-21 LAB — BASIC METABOLIC PANEL
Anion gap: 13 (ref 5–15)
BUN: 34 mg/dL — ABNORMAL HIGH (ref 8–23)
CO2: 29 mmol/L (ref 22–32)
Calcium: 9 mg/dL (ref 8.9–10.3)
Chloride: 97 mmol/L — ABNORMAL LOW (ref 98–111)
Creatinine, Ser: 0.56 mg/dL (ref 0.44–1.00)
GFR calc Af Amer: >60 mL/min (ref >60)
GFR calc non Af Amer: >60 mL/min (ref >60)
Glucose, Bld: 206 mg/dL — ABNORMAL HIGH (ref 70–99)
Potassium: 4.7 mmol/L (ref 3.5–5.1)
Sodium: 139 mmol/L (ref 135–145)

## 2019-09-21 LAB — CULTURE, RESPIRATORY W GRAM STAIN

## 2019-09-21 NOTE — Progress Notes (Signed)
Pulmonary Critical Care Medicine [facility] [place]   PULMONARY CRITICAL CARE SERVICE  PROGRESS NOTE  Date of Service: 09/21/2019  Tina Fuentes  YWV:[id number]  DOB: 02-24-1929   DOA: 08/15/2019  Referring Physician: Carron Curie, MD  HPI: Tina Fuentes is a 86 y.o. female seen for follow up of Acute on Chronic Respiratory Failure.  Patient currently is on T collar has been on 30% FiO2 she is at baseline  Medications: Reviewed on Rounds  Physical Exam:  Vitals: Temperature 97.1 pulse 82 respiratory 22 blood pressure is 155/79 saturations 95%  Ventilator Settings on T collar FiO2 30%  . General: Comfortable at this time . Eyes: Grossly normal lids, irises & conjunctiva . ENT: grossly tongue is normal . Neck: no obvious mass . Cardiovascular: S1 S2 normal no gallop . Respiratory: No rhonchi coarse breath sounds . Abdomen: soft . Skin: no rash seen on limited exam . Musculoskeletal: not rigid . Psychiatric:unable to assess . Neurologic: no seizure no involuntary movements         Lab Data:   Basic Metabolic Panel: Recent Labs  Lab 09/17/19 0627 09/21/19 0634  NA 139 139  K 4.3 4.7  CL 98 97*  CO2 28 29  GLUCOSE 153* 206*  BUN 32* 34*  CREATININE 0.58 0.56  CALCIUM 8.8* 9.0    ABG: No results for input(s): PHART, PCO2ART, PO2ART, HCO3, O2SAT in the last 168 hours.  Liver Function Tests: No results for input(s): AST, ALT, ALKPHOS, BILITOT, PROT, ALBUMIN in the last 168 hours. No results for input(s): LIPASE, AMYLASE in the last 168 hours. No results for input(s): AMMONIA in the last 168 hours.  CBC: Recent Labs  Lab 09/17/19 0627 09/21/19 0634  WBC 11.7* 11.9*  HGB 11.6* 12.0  HCT 38.1 39.4  MCV 101.3* 100.8*  PLT 228 265    Cardiac Enzymes: No results for input(s): CKTOTAL, CKMB, CKMBINDEX, TROPONINI in the last 168 hours.  BNP (last 3 results) No results for input(s): BNP in the last 8760 hours.  ProBNP (last 3 results) No results  for input(s): PROBNP in the last 8760 hours.  Radiological Exams: No results found.  Assessment/Plan Active Problems:   Acute on chronic respiratory failure with hypoxia (HCC)   Severe sepsis (HCC)   Chronic atrial fibrillation (HCC)   Chronic diastolic heart failure of unknown etiology (HCC)   Alzheimer's dementia without behavioral disturbance (HCC)   1. Acute on chronic respiratory failure with hypoxia plan is to continue with T collar trials titrate oxygen continue pulmonary toilet. 2. Severe sepsis hemodynamics are stable 3. Chronic atrial fibrillation rate is controlled 4. Chronic diastolic heart failure monitor fluid status 5. Alzheimer's dementia no change   I have personally seen and evaluated the patient, evaluated laboratory and imaging results, formulated the assessment and plan and placed orders. The Patient requires high complexity decision making with multiple systems involvement.  Rounds were done with the Respiratory Therapy Director and Staff therapists and discussed with nursing staff also.  Yevonne Pax, MD [facility] Pulmonary Critical Care Medicine Sleep Medicine

## 2019-09-22 DIAGNOSIS — I482 Chronic atrial fibrillation, unspecified: Secondary | ICD-10-CM | POA: Diagnosis not present

## 2019-09-22 DIAGNOSIS — J9621 Acute and chronic respiratory failure with hypoxia: Secondary | ICD-10-CM | POA: Diagnosis not present

## 2019-09-22 DIAGNOSIS — G309 Alzheimer's disease, unspecified: Secondary | ICD-10-CM | POA: Diagnosis not present

## 2019-09-22 DIAGNOSIS — I5032 Chronic diastolic (congestive) heart failure: Secondary | ICD-10-CM | POA: Diagnosis not present

## 2019-09-22 NOTE — Progress Notes (Signed)
Pulmonary Critical Care Medicine [facility] [place]   PULMONARY CRITICAL CARE SERVICE  PROGRESS NOTE  Date of Service: 09/22/2019  Tina Fuentes  [id number]  DOB: [birth year]   DOA: 08/15/2019  Referring Physician: Carron Curie, MD  HPI: Tina Fuentes is a 86 y.o. female seen for follow up of Acute on Chronic Respiratory Failure.  Patient currently is on T collar has been on 28% FiO2 with good saturations secretions are still copious  Medications: Reviewed on Rounds  Physical Exam:  Vitals: Temperature 97.4 pulse 79 respiratory rate 20 blood pressure is 142/89 saturations 97%  Ventilator Settings on T collar FiO2 28%  . General: Comfortable at this time . Eyes: Grossly normal lids, irises & conjunctiva . ENT: grossly tongue is normal . Neck: no obvious mass . Cardiovascular: S1 S2 normal no gallop . Respiratory: Coarse breath sounds few rhonchi . Abdomen: soft . Skin: no rash seen on limited exam . Musculoskeletal: not rigid . Psychiatric:unable to assess . Neurologic: no seizure no involuntary movements         Lab Data:   Basic Metabolic Panel: Recent Labs  Lab 09/17/19 0627 09/21/19 0634  NA 139 139  K 4.3 4.7  CL 98 97*  CO2 28 29  GLUCOSE 153* 206*  BUN 32* 34*  CREATININE 0.58 0.56  CALCIUM 8.8* 9.0    ABG: No results for input(s): PHART, PCO2ART, PO2ART, HCO3, O2SAT in the last 168 hours.  Liver Function Tests: No results for input(s): AST, ALT, ALKPHOS, BILITOT, PROT, ALBUMIN in the last 168 hours. No results for input(s): LIPASE, AMYLASE in the last 168 hours. No results for input(s): AMMONIA in the last 168 hours.  CBC: Recent Labs  Lab 09/17/19 0627 09/21/19 0634  WBC 11.7* 11.9*  HGB 11.6* 12.0  HCT 38.1 39.4  MCV 101.3* 100.8*  PLT 228 265    Cardiac Enzymes: No results for input(s): CKTOTAL, CKMB, CKMBINDEX, TROPONINI in the last 168 hours.  BNP (last 3 results) No results for input(s): BNP in the last 8760  hours.  ProBNP (last 3 results) No results for input(s): PROBNP in the last 8760 hours.  Radiological Exams: No results found.  Assessment/Plan Active Problems:   Acute on chronic respiratory failure with hypoxia (HCC)   Severe sepsis (HCC)   Chronic atrial fibrillation (HCC)   Chronic diastolic heart failure of unknown etiology (HCC)   Alzheimer's dementia without behavioral disturbance (HCC)   1. Acute on chronic respiratory failure with hypoxia continue with T collar trial titrate oxygen continue pulmonary toilet 2. Severe sepsis hemodynamics are stable 3. Chronic atrial fibrillation rate controlled 4. Chronic diastolic heart failure we will follow the fluid status closely 5. Alzheimer's dementia no change   I have personally seen and evaluated the patient, evaluated laboratory and imaging results, formulated the assessment and plan and placed orders. The Patient requires high complexity decision making with multiple systems involvement.  Rounds were done with the Respiratory Therapy Director and Staff therapists and discussed with nursing staff also.  Yevonne Pax, MD [facility] Pulmonary Critical Care Medicine Sleep Medicine

## 2019-09-23 DIAGNOSIS — I482 Chronic atrial fibrillation, unspecified: Secondary | ICD-10-CM | POA: Diagnosis not present

## 2019-09-23 DIAGNOSIS — I5032 Chronic diastolic (congestive) heart failure: Secondary | ICD-10-CM | POA: Diagnosis not present

## 2019-09-23 DIAGNOSIS — G309 Alzheimer's disease, unspecified: Secondary | ICD-10-CM | POA: Diagnosis not present

## 2019-09-23 DIAGNOSIS — J9621 Acute and chronic respiratory failure with hypoxia: Secondary | ICD-10-CM | POA: Diagnosis not present

## 2019-09-23 NOTE — Progress Notes (Signed)
Pulmonary Critical Care Medicine [facility] [place]   PULMONARY CRITICAL CARE SERVICE  PROGRESS NOTE  Date of Service: 09/23/2019  Tina Fuentes  PJA:[id number]  DOB: May 29, 1929   DOA: 08/15/2019  Referring Physician: Carron Curie, MD  HPI: Tina Fuentes is a 86 y.o. female seen for follow up of Acute on Chronic Respiratory Failure.  Patient currently is on T collar has been using PMV good saturations  Medications: Reviewed on Rounds  Physical Exam:  Vitals: Temperature is 98.1 pulse 79 respiratory rate 20 blood pressure is 136/72 saturations 98%  Ventilator Settings off the ventilator on T collar  . General: Comfortable at this time . Eyes: Grossly normal lids, irises & conjunctiva . ENT: grossly tongue is normal . Neck: no obvious mass . Cardiovascular: S1 S2 normal no gallop . Respiratory: No rhonchi no rales are noted at this time . Abdomen: soft . Skin: no rash seen on limited exam . Musculoskeletal: not rigid . Psychiatric:unable to assess . Neurologic: no seizure no involuntary movements         Lab Data:   Basic Metabolic Panel: Recent Labs  Lab 09/17/19 0627 09/21/19 0634  NA 139 139  K 4.3 4.7  CL 98 97*  CO2 28 29  GLUCOSE 153* 206*  BUN 32* 34*  CREATININE 0.58 0.56  CALCIUM 8.8* 9.0    ABG: No results for input(s): PHART, PCO2ART, PO2ART, HCO3, O2SAT in the last 168 hours.  Liver Function Tests: No results for input(s): AST, ALT, ALKPHOS, BILITOT, PROT, ALBUMIN in the last 168 hours. No results for input(s): LIPASE, AMYLASE in the last 168 hours. No results for input(s): AMMONIA in the last 168 hours.  CBC: Recent Labs  Lab 09/17/19 0627 09/21/19 0634  WBC 11.7* 11.9*  HGB 11.6* 12.0  HCT 38.1 39.4  MCV 101.3* 100.8*  PLT 228 265    Cardiac Enzymes: No results for input(s): CKTOTAL, CKMB, CKMBINDEX, TROPONINI in the last 168 hours.  BNP (last 3 results) No results for input(s): BNP in the last 8760 hours.  ProBNP  (last 3 results) No results for input(s): PROBNP in the last 8760 hours.  Radiological Exams: No results found.  Assessment/Plan Active Problems:   Acute on chronic respiratory failure with hypoxia (HCC)   Severe sepsis (HCC)   Chronic atrial fibrillation (HCC)   Chronic diastolic heart failure of unknown etiology (HCC)   Alzheimer's dementia without behavioral disturbance (HCC)   1. Acute on chronic respiratory failure with hypoxia we will continue with T collar patient secretions are limiting factor as far as being able to wean further 2. Severe sepsis hemodynamics are stable 3. Chronic atrial fibrillation rate controlled 4. Chronic diastolic heart failure appears to be compensated right now 5. Alzheimer's dementia no change   I have personally seen and evaluated the patient, evaluated laboratory and imaging results, formulated the assessment and plan and placed orders. The Patient requires high complexity decision making with multiple systems involvement.  Rounds were done with the Respiratory Therapy Director and Staff therapists and discussed with nursing staff also.  Yevonne Pax, MD [facility] Pulmonary Critical Care Medicine Sleep Medicine

## 2019-09-24 DIAGNOSIS — I482 Chronic atrial fibrillation, unspecified: Secondary | ICD-10-CM | POA: Diagnosis not present

## 2019-09-24 DIAGNOSIS — I5032 Chronic diastolic (congestive) heart failure: Secondary | ICD-10-CM | POA: Diagnosis not present

## 2019-09-24 DIAGNOSIS — J9621 Acute and chronic respiratory failure with hypoxia: Secondary | ICD-10-CM | POA: Diagnosis not present

## 2019-09-24 DIAGNOSIS — G309 Alzheimer's disease, unspecified: Secondary | ICD-10-CM | POA: Diagnosis not present

## 2019-09-24 NOTE — Progress Notes (Signed)
Pulmonary Critical Care Medicine [facility] [place]   PULMONARY CRITICAL CARE SERVICE  PROGRESS NOTE  Date of Service: 09/24/2019  Tina Fuentes  [id number]  DOB: 1928-11-25   DOA: 08/15/2019  Referring Physician: Carron Curie, MD  HPI: KYNADEE DAM is a 86 y.o. female seen for follow up of Acute on Chronic Respiratory Failure.  Patient this time is on T collar has been on 28% FiO2 this is her baseline secondary to copious secretions  Medications: Reviewed on Rounds  Physical Exam:  Vitals: Temperature is 97.3 pulse 73 respiratory 18 blood pressure is 155/80 saturations 97%  Ventilator Settings on T collar with an FiO2 of 28%  . General: Comfortable at this time . Eyes: Grossly normal lids, irises & conjunctiva . ENT: grossly tongue is normal . Neck: no obvious mass . Cardiovascular: S1 S2 normal no gallop . Respiratory: No rhonchi rales are noted at this time . Abdomen: soft . Skin: no rash seen on limited exam . Musculoskeletal: not rigid . Psychiatric:unable to assess . Neurologic: no seizure no involuntary movements         Lab Data:   Basic Metabolic Panel: Recent Labs  Lab 09/21/19 0634  NA 139  K 4.7  CL 97*  CO2 29  GLUCOSE 206*  BUN 34*  CREATININE 0.56  CALCIUM 9.0    ABG: No results for input(s): PHART, PCO2ART, PO2ART, HCO3, O2SAT in the last 168 hours.  Liver Function Tests: No results for input(s): AST, ALT, ALKPHOS, BILITOT, PROT, ALBUMIN in the last 168 hours. No results for input(s): LIPASE, AMYLASE in the last 168 hours. No results for input(s): AMMONIA in the last 168 hours.  CBC: Recent Labs  Lab 09/21/19 0634  WBC 11.9*  HGB 12.0  HCT 39.4  MCV 100.8*  PLT 265    Cardiac Enzymes: No results for input(s): CKTOTAL, CKMB, CKMBINDEX, TROPONINI in the last 168 hours.  BNP (last 3 results) No results for input(s): BNP in the last 8760 hours.  ProBNP (last 3 results) No results for input(s): PROBNP in the last  8760 hours.  Radiological Exams: No results found.  Assessment/Plan Active Problems:   Acute on chronic respiratory failure with hypoxia (HCC)   Severe sepsis (HCC)   Chronic atrial fibrillation (HCC)   Chronic diastolic heart failure of unknown etiology (HCC)   Alzheimer's dementia without behavioral disturbance (HCC)   1. Acute on chronic respiratory failure hypoxia we will continue with T collar trials titrate oxygen continue pulmonary toilet 2. Severe sepsis resolved 3. Chronic atrial fibrillation rate controlled 4. Chronic diastolic heart failure compensated we will continue to monitor 5. Alzheimer's dementia no change   I have personally seen and evaluated the patient, evaluated laboratory and imaging results, formulated the assessment and plan and placed orders. The Patient requires high complexity decision making with multiple systems involvement.  Rounds were done with the Respiratory Therapy Director and Staff therapists and discussed with nursing staff also.  Yevonne Pax, MD [facility] Pulmonary Critical Care Medicine Sleep Medicine

## 2019-09-25 LAB — SARS CORONAVIRUS 2 (TAT 6-24 HRS): SARS Coronavirus 2: NEGATIVE

## 2019-11-20 DEATH — deceased

## 2020-11-04 IMAGING — DX DG ABDOMEN 1V
1 series · 2 of 2 positions shown · non-contrast
Comparison: None.

CLINICAL DATA: Status post percutaneous endoscopic gastrostomy.
Assess tube placement.

EXAM:
ABDOMEN - 1 VIEW

[Series 1: abdomen · 0.14mm/px · 2 of 2 slices shown]
[im 1/2]
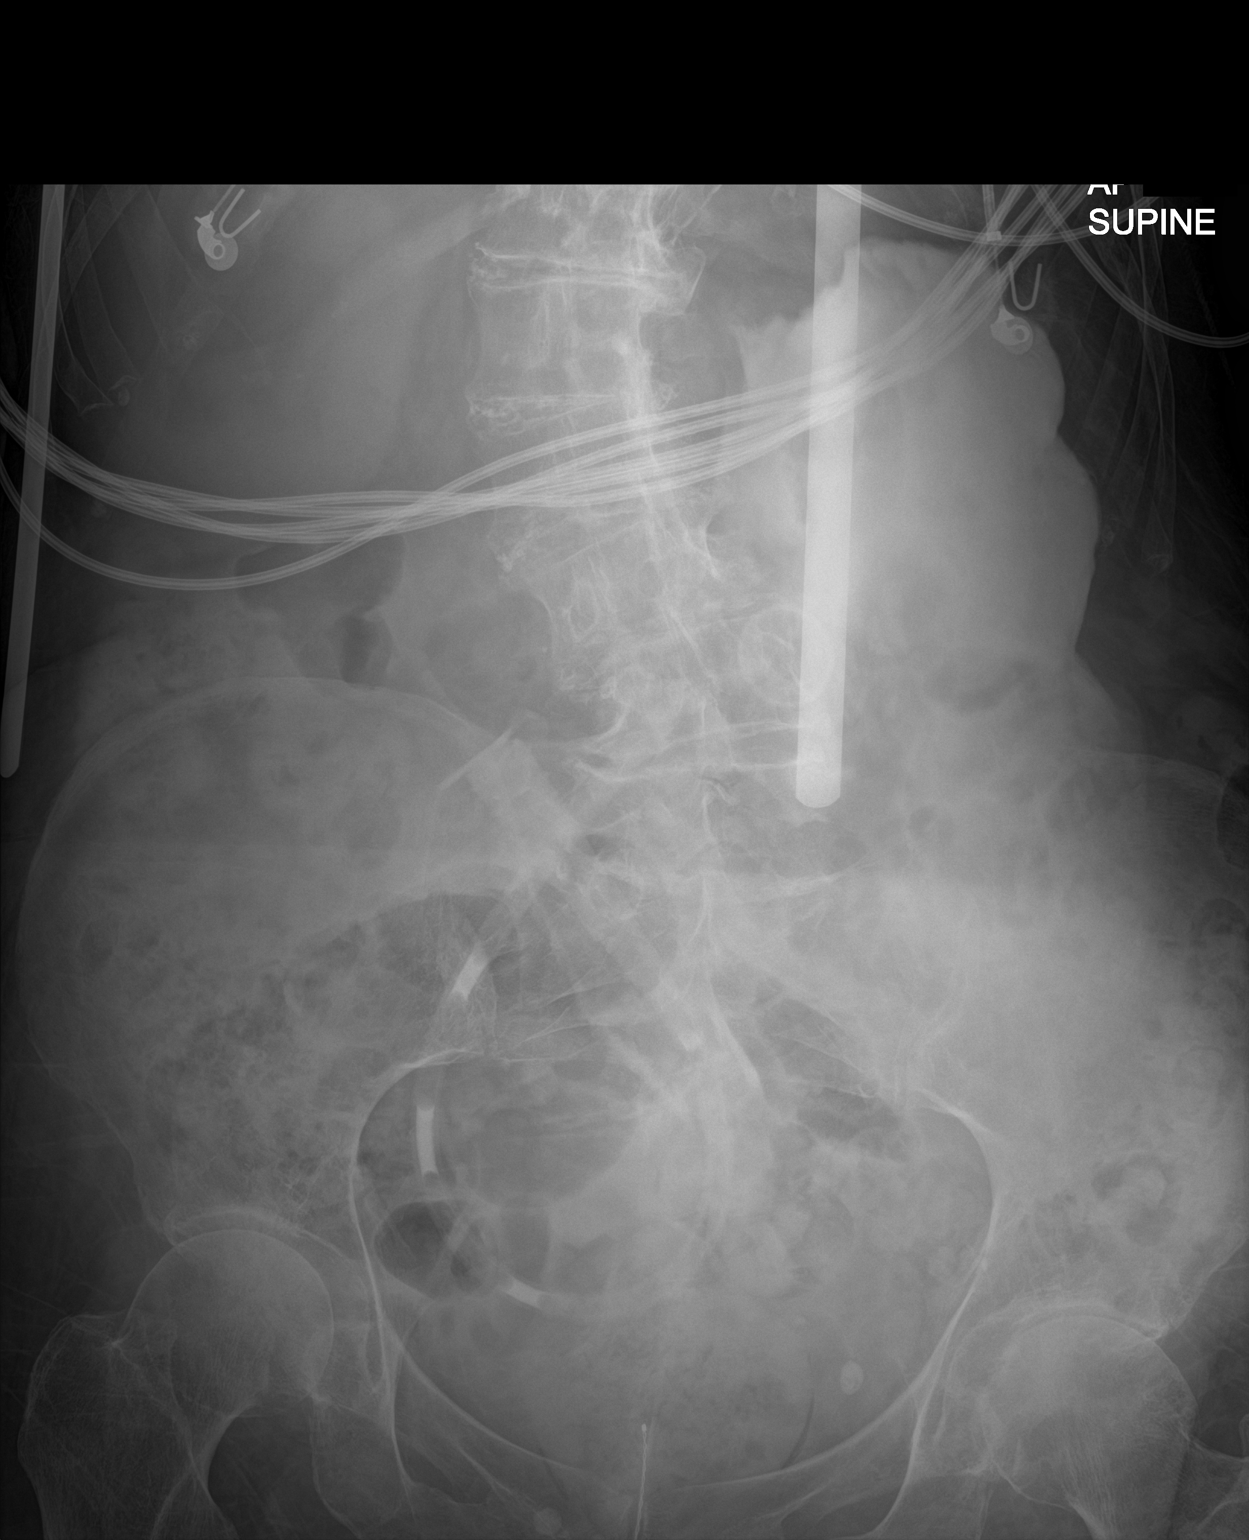
[im 2/2]
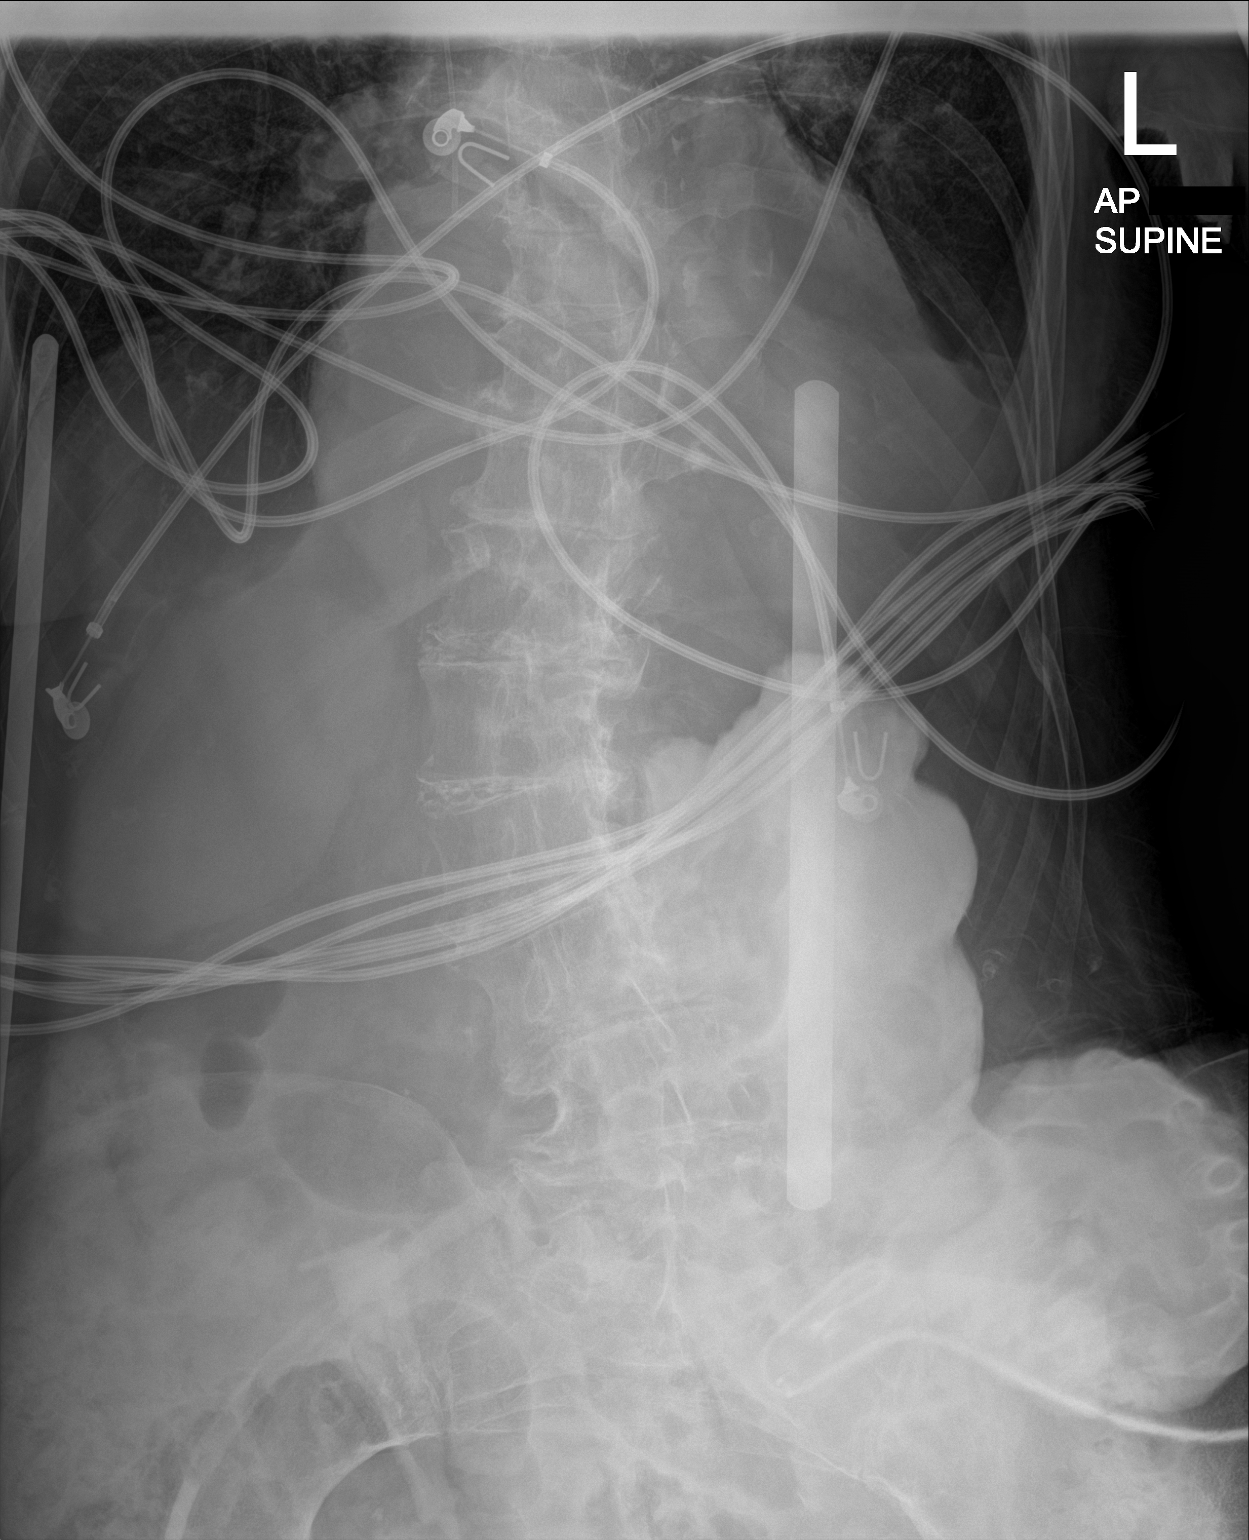

[2 of 2 positions shown; findings below may reference images not displayed]

FINDINGS: Two views were performed after injection of 50 cc of Omnipaque 300
by the clinical service. Contrast fills the fundus of the stomach. I
do not see any extravasated contrast or air. No sign of ileus or
obstruction. Rectal probe in place. Chronic curvature in
degenerative change of the spine.
IMPRESSION: Peg tube within the stomach.  No extravasation demonstrated.
